# Patient Record
Sex: Female | Born: 1950 | Race: White | Hispanic: No | Marital: Married | Smoking: Never smoker
Health system: Southern US, Community
[De-identification: ages and names within clinical notes are randomized; demographics above are authoritative.]

## PROBLEM LIST (undated history)

## (undated) DIAGNOSIS — M62838 Other muscle spasm: Secondary | ICD-10-CM

## (undated) DIAGNOSIS — D649 Anemia, unspecified: Secondary | ICD-10-CM

## (undated) DIAGNOSIS — M4800 Spinal stenosis, site unspecified: Secondary | ICD-10-CM

## (undated) DIAGNOSIS — M1990 Unspecified osteoarthritis, unspecified site: Secondary | ICD-10-CM

## (undated) DIAGNOSIS — I6529 Occlusion and stenosis of unspecified carotid artery: Secondary | ICD-10-CM

## (undated) DIAGNOSIS — E079 Disorder of thyroid, unspecified: Secondary | ICD-10-CM

## (undated) HISTORY — PX: COLONOSCOPY: SHX174

## (undated) HISTORY — DX: Unspecified osteoarthritis, unspecified site: M19.90

## (undated) HISTORY — PX: OVARIAN CYST REMOVAL: SHX89

## (undated) HISTORY — PX: DILATION AND CURETTAGE OF UTERUS: SHX78

## (undated) HISTORY — DX: Occlusion and stenosis of unspecified carotid artery: I65.29

## (undated) HISTORY — PX: BACK SURGERY: SHX140

## (undated) HISTORY — PX: BREAST BIOPSY: SHX20

## (undated) HISTORY — DX: Anemia, unspecified: D64.9

## (undated) HISTORY — DX: Disorder of thyroid, unspecified: E07.9

---

## 1978-06-29 DIAGNOSIS — E079 Disorder of thyroid, unspecified: Secondary | ICD-10-CM

## 1978-06-29 HISTORY — DX: Disorder of thyroid, unspecified: E07.9

## 2004-02-20 ENCOUNTER — Encounter: Admission: RE | Admit: 2004-02-20 | Discharge: 2004-02-20 | Payer: Self-pay | Admitting: Endocrinology

## 2012-02-28 HISTORY — PX: X-STOP IMPLANTATION: SHX2677

## 2012-08-08 ENCOUNTER — Ambulatory Visit (INDEPENDENT_AMBULATORY_CARE_PROVIDER_SITE_OTHER): Payer: BC Managed Care – PPO | Admitting: General Surgery

## 2012-08-08 ENCOUNTER — Encounter (INDEPENDENT_AMBULATORY_CARE_PROVIDER_SITE_OTHER): Payer: Self-pay | Admitting: General Surgery

## 2012-08-08 VITALS — BP 132/76 | HR 64 | Temp 97.5°F | Resp 14 | Ht 68.0 in | Wt 156.8 lb

## 2012-08-08 DIAGNOSIS — N6320 Unspecified lump in the left breast, unspecified quadrant: Secondary | ICD-10-CM

## 2012-08-08 DIAGNOSIS — N63 Unspecified lump in unspecified breast: Secondary | ICD-10-CM

## 2012-08-08 NOTE — Progress Notes (Signed)
Patient ID: Beth Mcdowell, female   DOB: [birth year], 62 y.o.   MRN: [id number]  Chief Complaint  Patient presents with  . Breast Problem    new pt- eval breast sclerosing lesion    HPI Beth Mcdowell is a 62 y.o. female.  Referred by Dr Jeralyn Ruths HPI This is a 62 year old female who is otherwise healthy who presented for her routine screening mammogram. She underwent a mammogram that was noted to have in the left breast in the slightly outer upper aspect a region of architectural distortion. Additional views with a 3-D mammogram were obtained. They confirmed a 1.2 cm focus of architectural distortion seen more clearly on these mammograms. An ultrasound was also performed at that time which showed focal architectural distortion at 12:00 3 cm from the nipple. This area measured 1.2 cm in diameter parallel to the chest wall. This underwent ultrasound guided vacuum assisted core biopsy with tissue marker placement. 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 This is thought to be discordant and she has been sent over for evaluation for excision. She reports no complaints referable to either breast. Past Medical History  Diagnosis Date  . Anemia   . Allergy   . Thyroid disease 1980    nodule- treated w/ synthroid for a couple of years    Past Surgical History  Procedure Laterality Date  . X-stop implantation  02/2012  . Ovarian cyst removal  1977, 1980    Family History  Problem Relation Age of Onset  . Heart disease Mother   . Heart disease Father     Social History History  Substance Use Topics  . Smoking status: Never Smoker   . Smokeless tobacco: Not on file  . Alcohol Use: Yes     Comment: 3 glasses/week    Allergies  Allergen Reactions  . Erythromycin     GI upset... Pt stays away from all mycins    Current Outpatient Prescriptions  Medication Sig Dispense Refill  . Ascorbic Acid (VITAMIN C) 100 MG tablet Take 100 mg by mouth  daily.      Marland Kitchen ibuprofen (ADVIL,MOTRIN) 200 MG tablet Take 200 mg by mouth every 6 (six) hours as needed for pain.       No current facility-administered medications for this visit.    Review of Systems Review of Systems  Constitutional: Negative for fever, chills and unexpected weight change.  HENT: Negative for hearing loss, congestion, sore throat, trouble swallowing and voice change.   Eyes: Negative for visual disturbance.  Respiratory: Negative for cough and wheezing.   Cardiovascular: Negative for chest pain, palpitations and leg swelling.  Gastrointestinal: Negative for nausea, vomiting, abdominal pain, diarrhea, constipation, blood in stool, abdominal distention and anal bleeding.  Genitourinary: Negative for hematuria, vaginal bleeding and difficulty urinating.  Musculoskeletal: Positive for arthralgias.  Skin: Negative for rash and wound.  Neurological: Negative for seizures, syncope and headaches.  Hematological: Negative for adenopathy. Does not bruise/bleed easily.  Psychiatric/Behavioral: Negative for confusion.    Blood pressure 132/76, pulse 64, temperature 97.5 F (36.4 C), temperature source Temporal, resp. rate 14, height 5\' 8"  (1.727 m), weight 156 lb 12.8 oz (71.124 kg).  Physical Exam Physical Exam  Vitals reviewed. Constitutional: She appears well-developed and well-nourished.  Cardiovascular: Normal rate, regular rhythm and normal heart sounds.   Pulmonary/Chest: Effort normal and breath sounds normal. She has no wheezes. She has no rales. Right breast exhibits no inverted nipple, no mass, no nipple discharge, no skin  change and no tenderness. Left breast exhibits no inverted nipple, no mass, no nipple discharge, no skin change and no tenderness. Breasts are symmetrical.  Lymphadenopathy:    She has no cervical adenopathy.    Data Reviewed mmg [place] reports, mmg images, path  Assessment    Left breast mammographic abnormality     Plan    We discussed  the indication for an excisional biopsy of this area. We discussed this was to ensure that there is marked it is any breast cancer. We discussed possible six-month followup. I think he would be more prudent to excise this area though given the discordance. We discussed the risks of a wire localized excisional biopsy including bleeding, infection, further surgery if there is cancer that is identified. However I think this likelihood is very low given her biopsy in appearance. There about to have another grandchild in about 3 weeks and she would like to do this in about 6 weeks which I think would be reasonable.       Beth Mcdowell 08/08/2012, 10:46 AM

## 2012-08-09 ENCOUNTER — Encounter (INDEPENDENT_AMBULATORY_CARE_PROVIDER_SITE_OTHER): Payer: Self-pay

## 2012-09-28 ENCOUNTER — Encounter (HOSPITAL_BASED_OUTPATIENT_CLINIC_OR_DEPARTMENT_OTHER): Payer: Self-pay

## 2012-09-28 ENCOUNTER — Ambulatory Visit (HOSPITAL_BASED_OUTPATIENT_CLINIC_OR_DEPARTMENT_OTHER): Admit: 2012-09-28 | Payer: Self-pay | Admitting: General Surgery

## 2012-09-28 SURGERY — BREAST BIOPSY WITH NEEDLE LOCALIZATION
Anesthesia: General | Laterality: Left

## 2012-10-18 ENCOUNTER — Encounter (INDEPENDENT_AMBULATORY_CARE_PROVIDER_SITE_OTHER): Payer: BC Managed Care – PPO | Admitting: General Surgery

## 2013-02-13 ENCOUNTER — Ambulatory Visit (INDEPENDENT_AMBULATORY_CARE_PROVIDER_SITE_OTHER): Payer: BC Managed Care – PPO | Admitting: General Surgery

## 2013-02-13 ENCOUNTER — Encounter (INDEPENDENT_AMBULATORY_CARE_PROVIDER_SITE_OTHER): Payer: Self-pay | Admitting: General Surgery

## 2013-02-13 VITALS — BP 126/64 | HR 78 | Temp 97.1°F | Resp 14 | Ht 68.0 in | Wt 152.6 lb

## 2013-02-13 DIAGNOSIS — N63 Unspecified lump in unspecified breast: Secondary | ICD-10-CM

## 2013-02-13 DIAGNOSIS — N6320 Unspecified lump in the left breast, unspecified quadrant: Secondary | ICD-10-CM

## 2013-02-13 NOTE — Progress Notes (Signed)
Patient ID: Beth Mcdowell, female   DOB: 11/24/1950, 62 y.o.   MRN: [id number]  Chief Complaint  Patient presents with  . Breast Cancer Long Term Follow Up    HPI Beth Mcdowell is a 62 y.o. female.   HPI This is a 62 year old female who is otherwise healthy who presented for her routine screening mammogram. She underwent a mammogram that was noted to have in the left breast in the slightly outer upper aspect a region of architectural distortion. Additional views with a 3-D mammogram were obtained. They confirmed a 1.2 cm focus of architectural distortion seen more clearly on these mammograms. An ultrasound was also performed at that time which showed focal architectural distortion at 12:00 3 cm from the nipple. This area measured 1.2 cm in diameter parallel to the chest wall. This underwent ultrasound guided vacuum assisted core biopsy with tissue marker placement. This was discordant at the time. She ended up choosing to undergo short interval followup. She is aware complaints referable to her breast right now. She does a fair amount of hip pain for which she was asking [place] to she should see. I referred her to Dr. Clelia Croft as I think she will need to see an orthopedist. On reevaluation a focal area of architectural distortion left breast persists and is slightly more dense although the overall size has not changed significantly measured about 2 cm. There are no associated microcalcifications. On ultrasound this area measures about 2 cm and appears to be stable. She was recommended by Dr. Yolanda Bonine consideration for excision again. She comes in today to discuss this and she is ready to have this excised..  Past Medical History  Diagnosis Date  . Anemia   . Allergy   . Thyroid disease 1980    nodule- treated w/ synthroid for a couple of years  . Arthritis     hips    Past Surgical History  Procedure Laterality Date  . X-stop implantation  02/2012  . Ovarian cyst removal  1977, 1980    Family History    Problem Relation Age of Onset  . Heart disease Mother   . Heart disease Father     Social History History  Substance Use Topics  . Smoking status: Never Smoker   . Smokeless tobacco: Not on file  . Alcohol Use: Yes     Comment: 3 glasses/week    Allergies  Allergen Reactions  . Erythromycin     GI upset... Pt stays away from all mycins    Current Outpatient Prescriptions  Medication Sig Dispense Refill  . Ascorbic Acid (VITAMIN C) 100 MG tablet Take 100 mg by mouth daily.      Marland Kitchen gabapentin (NEURONTIN) 100 MG capsule Take 100 mg by mouth 3 (three) times daily.      Marland Kitchen ibuprofen (ADVIL,MOTRIN) 200 MG tablet Take 200 mg by mouth every 6 (six) hours as needed for pain.       No current facility-administered medications for this visit.    Review of Systems Review of Systems  Constitutional: Negative for fever, chills and unexpected weight change.  HENT: Negative for hearing loss, congestion, sore throat, trouble swallowing and voice change.   Eyes: Negative for visual disturbance.  Respiratory: Negative for cough and wheezing.   Cardiovascular: Negative for chest pain, palpitations and leg swelling.  Gastrointestinal: Negative for nausea, vomiting, abdominal pain, diarrhea, constipation, blood in stool, abdominal distention and anal bleeding.  Genitourinary: Negative for hematuria, vaginal bleeding and difficulty urinating.  Musculoskeletal: Positive  for arthralgias.  Skin: Negative for rash and wound.  Neurological: Negative for seizures, syncope and headaches.  Hematological: Negative for adenopathy. Does not bruise/bleed easily.  Psychiatric/Behavioral: Negative for confusion.    Blood pressure 126/64, pulse 78, temperature 97.1 F (36.2 C), temperature source Temporal, resp. rate 14, height 5\' 8"  (1.727 m), weight 152 lb 9.6 oz (69.219 kg).  Physical Exam Physical Exam  Vitals reviewed. Constitutional: She appears well-developed and well-nourished.  Neck: Neck  supple. No thyromegaly present.  Cardiovascular: Normal rate, regular rhythm and normal heart sounds.   Pulmonary/Chest: Effort normal and breath sounds normal. She has no wheezes. She has no rales. Right breast exhibits no inverted nipple, no mass, no nipple discharge, no skin change and no tenderness. Left breast exhibits mass. Left breast exhibits no inverted nipple, no nipple discharge, no skin change and no tenderness.    Lymphadenopathy:    She has no cervical adenopathy.    Data Reviewed Prior notes and recent mm from [facility]  Assessment    Left breast mass     Plan    Left breast wire guided excision  This year he has gotten a little bit bigger since her last exam. She would like to proceed now with consideration of excisional biopsy. Really nothing much has changed since her last visit. I discussed with him again excisional biopsy with wire guidance. Risks include but are not limited to bleeding, infection, further surgery. We will plan on doing this as soon as we can        [facility] 02/13/2013, 9:18 AM

## 2013-02-15 ENCOUNTER — Encounter (HOSPITAL_COMMUNITY): Payer: Self-pay | Admitting: Pharmacy Technician

## 2013-02-16 NOTE — Pre-Procedure Instructions (Signed)
Beth Mcdowell  02/16/2013   Your procedure is scheduled on:  Monday February 20, 2013.  Report to [facility] immediately after leaving [facility].  Call this number if you have problems the morning of surgery: [phone]   Remember:   Do not eat food or drink liquids after midnight.   Take these medicines the morning of surgery with A SIP OF WATER: Gabapentin (Neurontin) if needed for pain  Stop taking Aspirin and herbal medications. Do not take any NSAIDs ie: Ibuprofen, Advil, Naproxen or any medication  containing Aspirin (naproxen sodium (ANAPROX) 220 MG tablet and Ibuprofen-Diphenhydramine Cit (ADVIL PM PO   Do not wear jewelry, make-up or nail polish.  Do not wear lotions, powders, or perfumes. You may NOT wear deodorant.  Do not shave 48 hours prior to surgery. .  Do not bring valuables to the hospital.  [facility] is not responsible for any belongings or valuables.  Contacts, dentures or bridgework may not be worn into surgery.  Leave suitcase in the car. After surgery it may be brought to your room.  For patients admitted to the hospital, checkout time is 11:00 AM the day of discharge.   Patients discharged the day of surgery will not be allowed to drive home.  Name and phone number of your driver: Family/Friend  Special Instructions: Shower using CHG 2 nights before surgery and the night before surgery.  If you shower the day of surgery use CHG.  Use special wash - you have one bottle of CHG for all showers.  You should use approximately 1/3 of the bottle for each shower.   Please read over the following fact sheets that you were given: Pain Booklet, Coughing and Deep Breathing and Surgical Site Infection Prevention

## 2013-02-17 ENCOUNTER — Encounter (HOSPITAL_COMMUNITY)
Admission: RE | Admit: 2013-02-17 | Discharge: 2013-02-17 | Disposition: A | Discharge status: Home / Self Care | Payer: BC Managed Care – PPO | Source: Ambulatory Visit | Attending: General Surgery | Admitting: General Surgery

## 2013-02-17 ENCOUNTER — Encounter (HOSPITAL_COMMUNITY): Payer: Self-pay

## 2013-02-17 HISTORY — DX: Spinal stenosis, site unspecified: M48.00

## 2013-02-17 LAB — BASIC METABOLIC PANEL
BUN: 19 mg/dL (ref 6–23)
CO2: 18 mEq/L — ABNORMAL LOW (ref 19–32)
Calcium: 9.9 mg/dL (ref 8.4–10.5)
Chloride: 105 mEq/L (ref 96–112)
Creatinine, Ser: 0.91 mg/dL (ref 0.50–1.10)
GFR calc Af Amer: 77 mL/min — ABNORMAL LOW (ref >90)

## 2013-02-17 LAB — CBC
HCT: 37.2 % (ref 36.0–46.0)
MCHC: 33.6 g/dL (ref 30.0–36.0)
MCV: 87.5 fL (ref 78.0–100.0)
Platelets: 224 10*3/uL (ref 150–400)
RDW: 12.6 % (ref 11.5–15.5)

## 2013-02-17 NOTE — Progress Notes (Signed)
Pt denies SOB, chest pain, and being under the care of a cardiologist. Pt denies having a stress test, echo, and cardiac catheterization. A  Message (voicemail) was left on Dr. Doreen Salvage surgical scheduler's telephone to verify where pt surgery is to be performed ( per pt).

## 2013-02-17 NOTE — Progress Notes (Signed)
Spoke with Katie from Dr. Kerry Dory office, she confirmed that pt is having procedure here at the [facility].

## 2013-02-19 MED ORDER — CEFAZOLIN SODIUM-DEXTROSE 2-3 GM-% IV SOLR
2.0000 g | INTRAVENOUS | Status: AC
  Administered 2013-02-20: 2 g via INTRAVENOUS
  Filled 2013-02-19: qty 50

## 2013-02-20 ENCOUNTER — Encounter (HOSPITAL_COMMUNITY)
Admission: RE | Disposition: A | Discharge status: Home / Self Care | Payer: Self-pay | Source: Ambulatory Visit | Attending: General Surgery

## 2013-02-20 ENCOUNTER — Ambulatory Visit (HOSPITAL_COMMUNITY)
Admission: RE | Admit: 2013-02-20 | Discharge: 2013-02-20 | Disposition: A | Discharge status: Home / Self Care | Payer: BC Managed Care – PPO | Source: Ambulatory Visit | Attending: General Surgery | Admitting: General Surgery

## 2013-02-20 ENCOUNTER — Ambulatory Visit (HOSPITAL_COMMUNITY): Payer: BC Managed Care – PPO | Admitting: Anesthesiology

## 2013-02-20 ENCOUNTER — Encounter (HOSPITAL_COMMUNITY): Payer: Self-pay | Admitting: Anesthesiology

## 2013-02-20 ENCOUNTER — Encounter (HOSPITAL_COMMUNITY): Payer: Self-pay | Admitting: *Deleted

## 2013-02-20 DIAGNOSIS — N6089 Other benign mammary dysplasias of unspecified breast: Secondary | ICD-10-CM

## 2013-02-20 HISTORY — PX: BREAST BIOPSY: SHX20

## 2013-02-20 LAB — HM MAMMOGRAPHY

## 2013-02-20 SURGERY — BREAST BIOPSY WITH NEEDLE LOCALIZATION
Anesthesia: General | Site: Breast | Laterality: Left | Wound class: Clean

## 2013-02-20 MED ORDER — PHENYLEPHRINE HCL 10 MG/ML IJ SOLN
INTRAMUSCULAR | Status: DC | PRN
  Administered 2013-02-20 (×2): 40 ug via INTRAVENOUS

## 2013-02-20 MED ORDER — OXYCODONE HCL 5 MG PO TABS: 5.0000 mg | ORAL_TABLET | Freq: Once | ORAL | Status: DC | PRN

## 2013-02-20 MED ORDER — METOCLOPRAMIDE HCL 5 MG/ML IJ SOLN
INTRAMUSCULAR | Status: DC | PRN
  Administered 2013-02-20: 5 mg via INTRAVENOUS

## 2013-02-20 MED ORDER — BUPIVACAINE HCL (PF) 0.25 % IJ SOLN
INTRAMUSCULAR | Status: AC
  Filled 2013-02-20: qty 30

## 2013-02-20 MED ORDER — SODIUM CHLORIDE 0.9 % IV SOLN: INTRAVENOUS | Status: DC

## 2013-02-20 MED ORDER — 0.9 % SODIUM CHLORIDE (POUR BTL) OPTIME
TOPICAL | Status: DC | PRN
  Administered 2013-02-20: 1000 mL

## 2013-02-20 MED ORDER — ONDANSETRON HCL 4 MG/2ML IJ SOLN
INTRAMUSCULAR | Status: DC | PRN
  Administered 2013-02-20: 4 mg via INTRAVENOUS

## 2013-02-20 MED ORDER — ONDANSETRON HCL 4 MG/2ML IJ SOLN: 4.0000 mg | Freq: Four times a day (QID) | INTRAMUSCULAR | Status: DC | PRN

## 2013-02-20 MED ORDER — DEXAMETHASONE SODIUM PHOSPHATE 4 MG/ML IJ SOLN
INTRAMUSCULAR | Status: DC | PRN
  Administered 2013-02-20: 4 mg via INTRAVENOUS

## 2013-02-20 MED ORDER — LACTATED RINGERS IV SOLN
INTRAVENOUS | Status: DC | PRN
  Administered 2013-02-20: 11:00:00 via INTRAVENOUS

## 2013-02-20 MED ORDER — BUPIVACAINE-EPINEPHRINE 0.25% -1:200000 IJ SOLN
INTRAMUSCULAR | Status: DC | PRN
  Administered 2013-02-20: 20 mL

## 2013-02-20 MED ORDER — ACETAMINOPHEN 650 MG RE SUPP: 650.0000 mg | RECTAL | Status: DC | PRN

## 2013-02-20 MED ORDER — SODIUM CHLORIDE 0.9 % IJ SOLN: 3.0000 mL | INTRAMUSCULAR | Status: DC | PRN

## 2013-02-20 MED ORDER — FENTANYL CITRATE 0.05 MG/ML IJ SOLN
INTRAMUSCULAR | Status: DC | PRN
  Administered 2013-02-20: 50 ug via INTRAVENOUS

## 2013-02-20 MED ORDER — ACETAMINOPHEN 325 MG PO TABS: 650.0000 mg | ORAL_TABLET | ORAL | Status: DC | PRN

## 2013-02-20 MED ORDER — EPHEDRINE SULFATE 50 MG/ML IJ SOLN
INTRAMUSCULAR | Status: DC | PRN
  Administered 2013-02-20: 10 mg via INTRAVENOUS
  Administered 2013-02-20: 5 mg via INTRAVENOUS
  Administered 2013-02-20: 10 mg via INTRAVENOUS

## 2013-02-20 MED ORDER — MORPHINE SULFATE 2 MG/ML IJ SOLN: 2.0000 mg | INTRAMUSCULAR | Status: DC | PRN

## 2013-02-20 MED ORDER — SODIUM CHLORIDE 0.9 % IV SOLN: 250.0000 mL | INTRAVENOUS | Status: DC | PRN

## 2013-02-20 MED ORDER — LIDOCAINE HCL (CARDIAC) 20 MG/ML IV SOLN
INTRAVENOUS | Status: DC | PRN
  Administered 2013-02-20: 50 mg via INTRAVENOUS

## 2013-02-20 MED ORDER — MIDAZOLAM HCL 5 MG/5ML IJ SOLN
INTRAMUSCULAR | Status: DC | PRN
  Administered 2013-02-20: 2 mg via INTRAVENOUS

## 2013-02-20 MED ORDER — HYDROMORPHONE HCL PF 1 MG/ML IJ SOLN: 0.2500 mg | INTRAMUSCULAR | Status: DC | PRN

## 2013-02-20 MED ORDER — OXYCODONE HCL 5 MG PO TABS: 5.0000 mg | ORAL_TABLET | ORAL | Status: DC | PRN

## 2013-02-20 MED ORDER — HYDROCODONE-ACETAMINOPHEN 10-325 MG PO TABS: 1.0000 | ORAL_TABLET | Freq: Four times a day (QID) | ORAL | Status: DC | PRN

## 2013-02-20 MED ORDER — SODIUM CHLORIDE 0.9 % IJ SOLN: 3.0000 mL | Freq: Two times a day (BID) | INTRAMUSCULAR | Status: DC

## 2013-02-20 MED ORDER — PROPOFOL 10 MG/ML IV BOLUS
INTRAVENOUS | Status: DC | PRN
  Administered 2013-02-20: 200 mg via INTRAVENOUS

## 2013-02-20 MED ORDER — LACTATED RINGERS IV SOLN
INTRAVENOUS | Status: DC
  Administered 2013-02-20: 10:00:00 via INTRAVENOUS

## 2013-02-20 MED ORDER — OXYCODONE HCL 5 MG/5ML PO SOLN: 5.0000 mg | Freq: Once | ORAL | Status: DC | PRN

## 2013-02-20 SURGICAL SUPPLY — 44 items
ADH SKN CLS APL DERMABOND .7 (GAUZE/BANDAGES/DRESSINGS) ×1
APPLIER CLIP 9.375 MED OPEN (MISCELLANEOUS)
APR CLP MED 9.3 20 MLT OPN (MISCELLANEOUS)
BINDER BREAST LRG (GAUZE/BANDAGES/DRESSINGS) IMPLANT
BINDER BREAST XLRG (GAUZE/BANDAGES/DRESSINGS) IMPLANT
CANISTER SUCTION 2500CC (MISCELLANEOUS) IMPLANT
CHLORAPREP W/TINT 26ML (MISCELLANEOUS) ×3 IMPLANT
CLIP APPLIE 9.375 MED OPEN (MISCELLANEOUS) IMPLANT
CLOSURE WOUND 1/2 X4 (GAUZE/BANDAGES/DRESSINGS) ×1
CLOTH BEACON ORANGE TIMEOUT ST (SAFETY) ×3 IMPLANT
COVER SURGICAL LIGHT HANDLE (MISCELLANEOUS) ×3 IMPLANT
DECANTER SPIKE VIAL GLASS SM (MISCELLANEOUS) ×3 IMPLANT
DERMABOND ADVANCED (GAUZE/BANDAGES/DRESSINGS) ×2
DERMABOND ADVANCED .7 DNX12 (GAUZE/BANDAGES/DRESSINGS) IMPLANT
DEVICE DUBIN SPECIMEN MAMMOGRA (MISCELLANEOUS) ×3 IMPLANT
DRAPE CHEST BREAST 15X10 FENES (DRAPES) ×3 IMPLANT
ELECT CAUTERY BLADE 6.4 (BLADE) ×3 IMPLANT
ELECT REM PT RETURN 9FT ADLT (ELECTROSURGICAL) ×3
ELECTRODE REM PT RTRN 9FT ADLT (ELECTROSURGICAL) ×1 IMPLANT
GLOVE BIO SURGEON STRL SZ7 (GLOVE) ×3 IMPLANT
GLOVE BIOGEL PI IND STRL 6.5 (GLOVE) IMPLANT
GLOVE BIOGEL PI IND STRL 7.5 (GLOVE) ×1 IMPLANT
GLOVE BIOGEL PI INDICATOR 6.5 (GLOVE) ×4
GLOVE BIOGEL PI INDICATOR 7.5 (GLOVE) ×4
GLOVE SURG SS PI 6.5 STRL IVOR (GLOVE) ×2 IMPLANT
GLOVE SURG SS PI 7.5 STRL IVOR (GLOVE) ×2 IMPLANT
GOWN STRL NON-REIN LRG LVL3 (GOWN DISPOSABLE) ×6 IMPLANT
KIT BASIN OR (CUSTOM PROCEDURE TRAY) ×3 IMPLANT
KIT MARKER MARGIN INK (KITS) IMPLANT
KIT ROOM TURNOVER OR (KITS) ×3 IMPLANT
NEEDLE HYPO 25GX1X1/2 BEV (NEEDLE) ×3 IMPLANT
NS IRRIG 1000ML POUR BTL (IV SOLUTION) ×3 IMPLANT
PACK GENERAL/GYN (CUSTOM PROCEDURE TRAY) ×3 IMPLANT
PAD ARMBOARD 7.5X6 YLW CONV (MISCELLANEOUS) ×3 IMPLANT
STAPLER VISISTAT 35W (STAPLE) ×3 IMPLANT
STRIP CLOSURE SKIN 1/2X4 (GAUZE/BANDAGES/DRESSINGS) ×1 IMPLANT
SUT MNCRL AB 4-0 PS2 18 (SUTURE) ×3 IMPLANT
SUT SILK 2 0 SH (SUTURE) IMPLANT
SUT VIC AB 2-0 SH 27 (SUTURE) ×3
SUT VIC AB 2-0 SH 27XBRD (SUTURE) IMPLANT
SUT VIC AB 3-0 SH 18 (SUTURE) ×3 IMPLANT
SYR CONTROL 10ML LL (SYRINGE) ×3 IMPLANT
TOWEL OR 17X24 6PK STRL BLUE (TOWEL DISPOSABLE) ×3 IMPLANT
TOWEL OR 17X26 10 PK STRL BLUE (TOWEL DISPOSABLE) ×3 IMPLANT

## 2013-02-20 NOTE — Anesthesia Postprocedure Evaluation (Signed)
Anesthesia Post Note  Patient: Beth Mcdowell  Procedure(s) Performed: Procedure(s) (LRB): Left breast wire guided excision (Left)  Anesthesia type: General  Patient location: PACU  Post pain: Pain level controlled and Adequate analgesia  Post assessment: Post-op Vital signs reviewed, Patient's Cardiovascular Status Stable, Respiratory Function Stable, Patent Airway and Pain level controlled  Last Vitals:  Filed Vitals:   02/20/13 1236  BP: 121/61  Pulse: 102  Temp: 36.3 C  Resp:     Post vital signs: Reviewed and stable  Level of consciousness: awake, alert  and oriented  Complications: No apparent anesthesia complications

## 2013-02-20 NOTE — Transfer of Care (Signed)
Immediate Anesthesia Transfer of Care Note  Patient: Beth Mcdowell  Procedure(s) Performed: Procedure(s): Left breast wire guided excision (Left)  Patient Location: PACU  Anesthesia Type:General  Level of Consciousness: awake, alert  and oriented  Airway & Oxygen Therapy: Patient Spontanous Breathing and Patient connected to nasal cannula oxygen  Post-op Assessment: Report given to PACU RN and Post -op Vital signs reviewed and stable  Post vital signs: Reviewed and stable  Complications: No apparent anesthesia complications

## 2013-02-20 NOTE — H&P (View-Only) (Signed)
Patient ID: Beth Mcdowell, female   DOB: 08/04/1950, 61 y.o.   MRN: [id number]  Chief Complaint  Patient presents with  . Breast Cancer Long Term Follow Up    HPI Beth Mcdowell is a 61 y.o. female.   HPI This is a 61-year-old female who is otherwise healthy who presented for her routine screening mammogram. She underwent a mammogram that was noted to have in the left breast in the slightly outer upper aspect a region of architectural distortion. Additional views with a 3-D mammogram were obtained. They confirmed a 1.2 cm focus of architectural distortion seen more clearly on these mammograms. An ultrasound was also performed at that time which showed focal architectural distortion at 12:00 3 cm from the nipple. This area measured 1.2 cm in diameter parallel to the chest wall. This underwent ultrasound guided vacuum assisted core biopsy with tissue marker placement. This was discordant at the time. She ended up choosing to undergo short interval followup. She is aware complaints referable to her breast right now. She does a fair amount of hip pain for which she was asking us to she should see. I referred her to Dr. Shaw as I think she will need to see an orthopedist. On reevaluation a focal area of architectural distortion left breast persists and is slightly more dense although the overall size has not changed significantly measured about 2 cm. There are no associated microcalcifications. On ultrasound this area measures about 2 cm and appears to be stable. She was recommended by Dr. Bertrand consideration for excision again. She comes in today to discuss this and she is ready to have this excised..  Past Medical History  Diagnosis Date  . Anemia   . Allergy   . Thyroid disease 1980    nodule- treated w/ synthroid for a couple of years  . Arthritis     hips    Past Surgical History  Procedure Laterality Date  . X-stop implantation  02/2012  . Ovarian cyst removal  1977, 1980    Family History    Problem Relation Age of Onset  . Heart disease Mother   . Heart disease Father     Social History History  Substance Use Topics  . Smoking status: Never Smoker   . Smokeless tobacco: Not on file  . Alcohol Use: Yes     Comment: 3 glasses/week    Allergies  Allergen Reactions  . Erythromycin     GI upset... Pt stays away from all mycins    Current Outpatient Prescriptions  Medication Sig Dispense Refill  . Ascorbic Acid (VITAMIN C) 100 MG tablet Take 100 mg by mouth daily.      . gabapentin (NEURONTIN) 100 MG capsule Take 100 mg by mouth 3 (three) times daily.      . ibuprofen (ADVIL,MOTRIN) 200 MG tablet Take 200 mg by mouth every 6 (six) hours as needed for pain.       No current facility-administered medications for this visit.    Review of Systems Review of Systems  Constitutional: Negative for fever, chills and unexpected weight change.  HENT: Negative for hearing loss, congestion, sore throat, trouble swallowing and voice change.   Eyes: Negative for visual disturbance.  Respiratory: Negative for cough and wheezing.   Cardiovascular: Negative for chest pain, palpitations and leg swelling.  Gastrointestinal: Negative for nausea, vomiting, abdominal pain, diarrhea, constipation, blood in stool, abdominal distention and anal bleeding.  Genitourinary: Negative for hematuria, vaginal bleeding and difficulty urinating.  Musculoskeletal: Positive   for arthralgias.  Skin: Negative for rash and wound.  Neurological: Negative for seizures, syncope and headaches.  Hematological: Negative for adenopathy. Does not bruise/bleed easily.  Psychiatric/Behavioral: Negative for confusion.    Blood pressure 126/64, pulse 78, temperature 97.1 F (36.2 C), temperature source Temporal, resp. rate 14, height 5' 8" (1.727 m), weight 152 lb 9.6 oz (69.219 kg).  Physical Exam Physical Exam  Vitals reviewed. Constitutional: She appears well-developed and well-nourished.  Neck: Neck  supple. No thyromegaly present.  Cardiovascular: Normal rate, regular rhythm and normal heart sounds.   Pulmonary/Chest: Effort normal and breath sounds normal. She has no wheezes. She has no rales. Right breast exhibits no inverted nipple, no mass, no nipple discharge, no skin change and no tenderness. Left breast exhibits mass. Left breast exhibits no inverted nipple, no nipple discharge, no skin change and no tenderness.    Lymphadenopathy:    She has no cervical adenopathy.    Data Reviewed Prior notes and recent mm from [facility]  Assessment    Left breast mass     Plan    Left breast wire guided excision  This year he has gotten a little bit bigger since her last exam. She would like to proceed now with consideration of excisional biopsy. Really nothing much has changed since her last visit. I discussed with him again excisional biopsy with wire guidance. Risks include but are not limited to bleeding, infection, further surgery. We will plan on doing this as soon as we can        Beth Mcdowell 02/13/2013, 9:18 AM    

## 2013-02-20 NOTE — Anesthesia Procedure Notes (Signed)
Procedure Name: LMA Insertion Date/Time: 02/20/2013 11:32 AM Performed by: Carmela Rima Pre-anesthesia Checklist: Patient identified, Timeout performed, Emergency Drugs available, Patient being monitored and Suction available Patient Re-evaluated:Patient Re-evaluated prior to inductionOxygen Delivery Method: Circle system utilized Preoxygenation: Pre-oxygenation with 100% oxygen Intubation Type: IV induction Ventilation: Mask ventilation without difficulty LMA: LMA inserted LMA Size: 4.0 Number of attempts: 1 Placement Confirmation: positive ETCO2 and breath sounds checked- equal and bilateral Tube secured with: Tape Dental Injury: Teeth and Oropharynx as per pre-operative assessment

## 2013-02-20 NOTE — Anesthesia Preprocedure Evaluation (Addendum)
Anesthesia Evaluation  Patient identified by MRN, date of birth, ID band Patient awake    Reviewed: Allergy & Precautions, H&P , NPO status , Patient's Chart, lab work & pertinent test results  Airway Mallampati: II TM Distance: >3 FB Neck ROM: full    Dental  (+) Teeth Intact and Dental Advidsory Given   Pulmonary          Cardiovascular     Neuro/Psych    GI/Hepatic   Endo/Other    Renal/GU      Musculoskeletal  (+) Arthritis -,   Abdominal   Peds  Hematology   Anesthesia Other Findings   Reproductive/Obstetrics                          Anesthesia Physical Anesthesia Plan  ASA: II  Anesthesia Plan: General   Post-op Pain Management:    Induction: Intravenous  Airway Management Planned: LMA  Additional Equipment:   Intra-op Plan:   Post-operative Plan:   Informed Consent: I have reviewed the patients History and Physical, chart, labs and discussed the procedure including the risks, benefits and alternatives for the proposed anesthesia with the patient or authorized representative who has indicated his/her understanding and acceptance.   Dental Advisory Given  Plan Discussed with: CRNA, Anesthesiologist and Surgeon  Anesthesia Plan Comments:        Anesthesia Quick Evaluation

## 2013-02-20 NOTE — Preoperative (Signed)
Beta Blockers   Reason not to administer Beta Blockers:Not Applicable 

## 2013-02-20 NOTE — Interval H&P Note (Signed)
History and Physical Interval Note:  02/20/2013 11:13 AM  Beth Mcdowell  has presented today for surgery, with the diagnosis of left breast mass  The various methods of treatment have been discussed with the patient and family. After consideration of risks, benefits and other options for treatment, the patient has consented to  Procedure(s): LEft breast wire guided excision (Left) as a surgical intervention .  The patient's history has been reviewed, patient examined, no change in status, stable for surgery.  I have reviewed the patient's chart and labs.  Questions were answered to the patient's satisfaction.     Zelphia Glover

## 2013-02-20 NOTE — Op Note (Signed)
Preoperative diagnosis: left breast mammographic abnormality Postoperative diagnosis: same as above Procedure: left breast wire guided excision Surgeon: Dr Harden Mo Anes: general EBL: minimal Specimen: left breast marked with short stitch superior, long stitch lateral, double deep Drains: none Complications: none Sponge and needle count correct at completion Disposition to recovery in stable condition  Indications: This is a 62 year old female with a vaguely palpable left breast mass that underwent recent evaluation with a biopsy that was discordant. She decided undergo short-term followup and there has been some change in this area. She was encouraged for excision by the radiologist and I agree. We discussed a left breast excision with wire guidance.  Procedure: After informed consent was obtained the patient first had a wire placed. I had the mammograms in the operating room. She was given cefazolin. Sequential compression devices were placed on her legs. She was then placed under general anesthesia without complication. Her left breast was prepped and draped in the standard sterile surgical fashion. A surgical timeout was then performed.  I infiltrated Marcaine throughout the region of the abnormality. I then made a curvilinear incision overlying the abnormality. I brought the wire in from remotely. I then used cautery to excise the wire and all of the surrounding tissue. There was a vaguely palpable mass in this tissue. Upon completion the mammogram confirmed removal of the mass as well as the clip and wire. I then obtained hemostasis. I closed the breast tissue with 2-0 Vicryl. The dermis was closed with 3-0 Vicryl. The skin was closed with 4-0 Monocryl, and Dermabond, and Steri-Strips. She tolerated this well was extubated and transferred to recovery stable.

## 2013-02-21 ENCOUNTER — Encounter (HOSPITAL_COMMUNITY): Payer: Self-pay | Admitting: General Surgery

## 2013-03-07 ENCOUNTER — Encounter (INDEPENDENT_AMBULATORY_CARE_PROVIDER_SITE_OTHER): Payer: Self-pay | Admitting: General Surgery

## 2013-03-07 ENCOUNTER — Encounter (INDEPENDENT_AMBULATORY_CARE_PROVIDER_SITE_OTHER): Payer: Self-pay

## 2013-03-07 ENCOUNTER — Ambulatory Visit (INDEPENDENT_AMBULATORY_CARE_PROVIDER_SITE_OTHER): Payer: BC Managed Care – PPO | Admitting: General Surgery

## 2013-03-07 VITALS — BP 96/58 | HR 64 | Resp 16 | Ht 68.0 in | Wt 151.8 lb

## 2013-03-07 DIAGNOSIS — Z09 Encounter for follow-up examination after completed treatment for conditions other than malignant neoplasm: Secondary | ICD-10-CM

## 2013-03-07 NOTE — Progress Notes (Signed)
Subjective:     Patient ID: Beth Mcdowell, female   DOB: 13-Oct-1950, 62 y.o.   MRN: [id number]  HPI This is a 62 year old female who underwent a left breast wire localized biopsy for a mammographic abnormality. She has done well and returns today with no complaints. Her pathology showed a 7 mm radial scar without any evidence of malignancy.  Review of Systems     Objective:   Physical Exam Well-healing left breast incision with Steri-Strips in place, without infection    Assessment:     Status post left breast wire guided excision of radial scar     Plan:     She can return to full activity. We discussed her pathology today. We discussed continuing her annual screening mammography, screening clinical exams, and self breast exams. She will come back and see me as needed.

## 2013-03-16 ENCOUNTER — Encounter (INDEPENDENT_AMBULATORY_CARE_PROVIDER_SITE_OTHER): Payer: Self-pay

## 2013-03-28 ENCOUNTER — Encounter (INDEPENDENT_AMBULATORY_CARE_PROVIDER_SITE_OTHER): Payer: Self-pay

## 2013-05-18 ENCOUNTER — Encounter (HOSPITAL_COMMUNITY): Payer: Self-pay | Admitting: Pharmacy Technician

## 2013-05-23 ENCOUNTER — Encounter (HOSPITAL_COMMUNITY): Payer: Self-pay

## 2013-05-23 ENCOUNTER — Encounter (HOSPITAL_COMMUNITY)
Admission: RE | Admit: 2013-05-23 | Discharge: 2013-05-23 | Disposition: A | Discharge status: Home / Self Care | Payer: BC Managed Care – PPO | Source: Ambulatory Visit | Attending: Orthopaedic Surgery | Admitting: Orthopaedic Surgery

## 2013-05-23 ENCOUNTER — Other Ambulatory Visit (HOSPITAL_COMMUNITY): Payer: Self-pay | Admitting: Orthopaedic Surgery

## 2013-05-23 DIAGNOSIS — Z01818 Encounter for other preprocedural examination: Secondary | ICD-10-CM | POA: Insufficient documentation

## 2013-05-23 DIAGNOSIS — Z01812 Encounter for preprocedural laboratory examination: Secondary | ICD-10-CM | POA: Insufficient documentation

## 2013-05-23 HISTORY — DX: Other muscle spasm: M62.838

## 2013-05-23 LAB — SURGICAL PCR SCREEN
MRSA, PCR: NEGATIVE
Staphylococcus aureus: NEGATIVE

## 2013-05-23 LAB — ABO/RH: ABO/RH(D): A POS

## 2013-05-23 NOTE — Pre-Procedure Instructions (Signed)
PT BROUGHT LAB REPORTS- THAT WERE DONE BY DR. Lacretia Nicks. [facility] ON 05/16/13 - CBC, CMET, URINALYSIS.--REPORTS ARE ON PT'S CHART - WITH IN NORMAL LIMITS AND NOT REPEATED TODAY.  PT, PTT AND T&S WERE DRAWN TODAY PREOP AT [facility]. CXR AND EKG NOT NEEDED PER ANESTHESIOLOGIST'S GUIDELINES.  PT DID BRING HER LAST EKG REPORT - FROM 05/16/12 - NORMAL EKG.

## 2013-05-23 NOTE — Patient Instructions (Addendum)
YOUR SURGERY IS SCHEDULED AT [facility]  ON:  Friday  12/5  REPORT TO  SHORT STAY CENTER AT:  7:30 AM      PHONE # FOR SHORT STAY IS [phone]  DO NOT EAT OR DRINK ANYTHING AFTER MIDNIGHT THE NIGHT BEFORE YOUR SURGERY.  YOU MAY BRUSH YOUR TEETH, RINSE OUT YOUR MOUTH--BUT NO WATER, NO FOOD, NO CHEWING GUM, NO MINTS, NO CANDIES, NO CHEWING TOBACCO.  PLEASE TAKE THE FOLLOWING MEDICATIONS THE AM OF YOUR SURGERY WITH A FEW SIPS OF WATER:  HYDROCODONE / ACETAMINOPHEN FOR PAIN IF NEEDED.    DO NOT BRING VALUABLES, MONEY, CREDIT CARDS.  DO NOT WEAR JEWELRY, MAKE-UP, NAIL POLISH AND NO METAL PINS OR CLIPS IN YOUR HAIR. CONTACT LENS, DENTURES / PARTIALS, GLASSES SHOULD NOT BE WORN TO SURGERY AND IN MOST CASES-HEARING AIDS WILL NEED TO BE REMOVED.  BRING YOUR GLASSES CASE, ANY EQUIPMENT NEEDED FOR YOUR CONTACT LENS. FOR PATIENTS ADMITTED TO THE HOSPITAL--CHECK OUT TIME THE DAY OF DISCHARGE IS 11:00 AM.  ALL INPATIENT ROOMS ARE PRIVATE - WITH BATHROOM, TELEPHONE, TELEVISION AND WIFI INTERNET.                                                     PLEASE READ OVER ANY  FACT SHEETS THAT YOU WERE GIVEN: MRSA INFORMATION, BLOOD TRANSFUSION INFORMATION, INCENTIVE SPIROMETER INFORMATION.  FAILURE TO FOLLOW THESE INSTRUCTIONS MAY RESULT IN THE CANCELLATION OF YOUR SURGERY. PLEASE BE AWARE THAT YOU MAY NEED ADDITIONAL BLOOD DRAWN DAY OF YOUR SURGERY  PATIENT SIGNATURE_________________________________  PT'S HUSBAND NOTIFIED OF SURGERY TIME CHANGE TO 7:30 AM - AND SHE NEEDS TO ARRIVE BY 5:30 AM.   ALL OTHER INSTRUCTIONS ARE THE SAME.  PT TO CALL ME BACK IF ANY QUESTIONS.

## 2013-06-01 NOTE — Anesthesia Preprocedure Evaluation (Addendum)
Anesthesia Evaluation  Patient identified by MRN, date of birth, ID band Patient awake    Reviewed: Allergy & Precautions, H&P , NPO status , Patient's Chart, lab work & pertinent test results  Airway Mallampati: II TM Distance: >3 FB Neck ROM: full    Dental  (+) Caps and Dental Advisory Given All upper front are capped:   Pulmonary neg pulmonary ROS,  breath sounds clear to auscultation  Pulmonary exam normal       Cardiovascular Exercise Tolerance: Good negative cardio ROS  Rhythm:regular Rate:Normal     Neuro/Psych Spinal stenosis with spasms both legs and feet negative neurological ROS  negative psych ROS   GI/Hepatic negative GI ROS, Neg liver ROS,   Endo/Other  negative endocrine ROS  Renal/GU negative Renal ROS  negative genitourinary   Musculoskeletal   Abdominal   Peds  Hematology negative hematology ROS (+)   Anesthesia Other Findings   Reproductive/Obstetrics negative OB ROS                          Anesthesia Physical Anesthesia Plan  ASA: II  Anesthesia Plan: General   Post-op Pain Management:    Induction: Intravenous  Airway Management Planned: Oral ETT  Additional Equipment:   Intra-op Plan:   Post-operative Plan: Extubation in OR  Informed Consent: I have reviewed the patients History and Physical, chart, labs and discussed the procedure including the risks, benefits and alternatives for the proposed anesthesia with the patient or authorized representative who has indicated his/her understanding and acceptance.   Dental Advisory Given  Plan Discussed with: CRNA and Surgeon  Anesthesia Plan Comments:         Anesthesia Quick Evaluation

## 2013-06-02 ENCOUNTER — Encounter (HOSPITAL_COMMUNITY)
Admission: RE | Disposition: A | Discharge status: Home Health | Payer: Self-pay | Source: Ambulatory Visit | Attending: Orthopaedic Surgery

## 2013-06-02 ENCOUNTER — Encounter (HOSPITAL_COMMUNITY): Payer: BC Managed Care – PPO | Admitting: Anesthesiology

## 2013-06-02 ENCOUNTER — Inpatient Hospital Stay (HOSPITAL_COMMUNITY): Payer: BC Managed Care – PPO | Admitting: Anesthesiology

## 2013-06-02 ENCOUNTER — Inpatient Hospital Stay (HOSPITAL_COMMUNITY)
Admission: RE | Admit: 2013-06-02 | Discharge: 2013-06-04 | DRG: 470 | Disposition: A | Discharge status: Home Health | Payer: BC Managed Care – PPO | Source: Ambulatory Visit | Attending: Orthopaedic Surgery | Admitting: Orthopaedic Surgery

## 2013-06-02 ENCOUNTER — Inpatient Hospital Stay (HOSPITAL_COMMUNITY): Payer: BC Managed Care – PPO

## 2013-06-02 ENCOUNTER — Encounter (HOSPITAL_COMMUNITY): Payer: Self-pay | Admitting: *Deleted

## 2013-06-02 DIAGNOSIS — Z8249 Family history of ischemic heart disease and other diseases of the circulatory system: Secondary | ICD-10-CM

## 2013-06-02 DIAGNOSIS — M4800 Spinal stenosis, site unspecified: Secondary | ICD-10-CM | POA: Diagnosis present

## 2013-06-02 DIAGNOSIS — D62 Acute posthemorrhagic anemia: Secondary | ICD-10-CM | POA: Diagnosis present

## 2013-06-02 DIAGNOSIS — M1610 Unilateral primary osteoarthritis, unspecified hip: Principal | ICD-10-CM | POA: Diagnosis present

## 2013-06-02 DIAGNOSIS — Z79899 Other long term (current) drug therapy: Secondary | ICD-10-CM

## 2013-06-02 DIAGNOSIS — Z96649 Presence of unspecified artificial hip joint: Secondary | ICD-10-CM

## 2013-06-02 DIAGNOSIS — M169 Osteoarthritis of hip, unspecified: Secondary | ICD-10-CM

## 2013-06-02 HISTORY — PX: TOTAL HIP ARTHROPLASTY: SHX124

## 2013-06-02 LAB — TYPE AND SCREEN: Antibody Screen: NEGATIVE

## 2013-06-02 SURGERY — ARTHROPLASTY, HIP, TOTAL, ANTERIOR APPROACH
Anesthesia: General | Site: Hip | Laterality: Right

## 2013-06-02 MED ORDER — ONDANSETRON HCL 4 MG/2ML IJ SOLN
INTRAMUSCULAR | Status: DC | PRN
  Administered 2013-06-02: 4 mg via INTRAVENOUS

## 2013-06-02 MED ORDER — LACTATED RINGERS IV SOLN: INTRAVENOUS | Status: DC

## 2013-06-02 MED ORDER — TRANEXAMIC ACID 100 MG/ML IV SOLN
1000.0000 mg | INTRAVENOUS | Status: AC
  Administered 2013-06-02: 1000 mg via INTRAVENOUS
  Filled 2013-06-02: qty 10

## 2013-06-02 MED ORDER — BISACODYL 10 MG RE SUPP: 10.0000 mg | Freq: Every day | RECTAL | Status: DC | PRN

## 2013-06-02 MED ORDER — ASPIRIN EC 325 MG PO TBEC
325.0000 mg | DELAYED_RELEASE_TABLET | Freq: Two times a day (BID) | ORAL | Status: DC
  Administered 2013-06-02 – 2013-06-04 (×5): 325 mg via ORAL
  Filled 2013-06-02 (×6): qty 1

## 2013-06-02 MED ORDER — 0.9 % SODIUM CHLORIDE (POUR BTL) OPTIME
TOPICAL | Status: DC | PRN
  Administered 2013-06-02: 1000 mL

## 2013-06-02 MED ORDER — METOCLOPRAMIDE HCL 10 MG PO TABS: 5.0000 mg | ORAL_TABLET | Freq: Three times a day (TID) | ORAL | Status: DC | PRN

## 2013-06-02 MED ORDER — CEFAZOLIN SODIUM-DEXTROSE 2-3 GM-% IV SOLR
INTRAVENOUS | Status: AC
  Filled 2013-06-02: qty 50

## 2013-06-02 MED ORDER — DOCUSATE SODIUM 100 MG PO CAPS
100.0000 mg | ORAL_CAPSULE | Freq: Two times a day (BID) | ORAL | Status: DC
  Administered 2013-06-02 – 2013-06-04 (×5): 100 mg via ORAL
  Filled 2013-06-02: qty 1

## 2013-06-02 MED ORDER — DIPHENHYDRAMINE HCL 12.5 MG/5ML PO ELIX: 12.5000 mg | ORAL_SOLUTION | ORAL | Status: DC | PRN

## 2013-06-02 MED ORDER — ACETAMINOPHEN 325 MG PO TABS
650.0000 mg | ORAL_TABLET | Freq: Four times a day (QID) | ORAL | Status: DC | PRN
  Administered 2013-06-03 – 2013-06-04 (×3): 650 mg via ORAL
  Filled 2013-06-02 (×3): qty 2

## 2013-06-02 MED ORDER — LIDOCAINE HCL (CARDIAC) 20 MG/ML IV SOLN
INTRAVENOUS | Status: AC
  Filled 2013-06-02: qty 5

## 2013-06-02 MED ORDER — ONDANSETRON HCL 4 MG PO TABS
4.0000 mg | ORAL_TABLET | Freq: Four times a day (QID) | ORAL | Status: DC | PRN
  Administered 2013-06-03: 4 mg via ORAL
  Filled 2013-06-02: qty 1

## 2013-06-02 MED ORDER — ZOLPIDEM TARTRATE 5 MG PO TABS: 5.0000 mg | ORAL_TABLET | Freq: Every evening | ORAL | Status: DC | PRN

## 2013-06-02 MED ORDER — SUCCINYLCHOLINE CHLORIDE 20 MG/ML IJ SOLN
INTRAMUSCULAR | Status: AC
  Filled 2013-06-02: qty 1

## 2013-06-02 MED ORDER — HYDROMORPHONE HCL PF 1 MG/ML IJ SOLN
1.0000 mg | INTRAMUSCULAR | Status: DC | PRN
  Administered 2013-06-02: 0.5 mg via INTRAVENOUS
  Filled 2013-06-02: qty 1

## 2013-06-02 MED ORDER — ACETAMINOPHEN 650 MG RE SUPP: 650.0000 mg | Freq: Four times a day (QID) | RECTAL | Status: DC | PRN

## 2013-06-02 MED ORDER — NEOSTIGMINE METHYLSULFATE 1 MG/ML IJ SOLN
INTRAMUSCULAR | Status: AC
  Filled 2013-06-02: qty 10

## 2013-06-02 MED ORDER — PHENOL 1.4 % MT LIQD: 1.0000 | OROMUCOSAL | Status: DC | PRN

## 2013-06-02 MED ORDER — SUCCINYLCHOLINE CHLORIDE 20 MG/ML IJ SOLN
INTRAMUSCULAR | Status: DC | PRN
  Administered 2013-06-02: 70 mg via INTRAVENOUS

## 2013-06-02 MED ORDER — HYDROMORPHONE HCL PF 1 MG/ML IJ SOLN
INTRAMUSCULAR | Status: AC
  Filled 2013-06-02: qty 1

## 2013-06-02 MED ORDER — METOCLOPRAMIDE HCL 5 MG/ML IJ SOLN
5.0000 mg | Freq: Three times a day (TID) | INTRAMUSCULAR | Status: DC | PRN
  Administered 2013-06-02: 10 mg via INTRAVENOUS
  Filled 2013-06-02: qty 2

## 2013-06-02 MED ORDER — DEXAMETHASONE SODIUM PHOSPHATE 10 MG/ML IJ SOLN
INTRAMUSCULAR | Status: DC | PRN
  Administered 2013-06-02: 10 mg via INTRAVENOUS

## 2013-06-02 MED ORDER — EPHEDRINE SULFATE 50 MG/ML IJ SOLN
INTRAMUSCULAR | Status: AC
  Filled 2013-06-02: qty 1

## 2013-06-02 MED ORDER — ONDANSETRON HCL 4 MG/2ML IJ SOLN: 4.0000 mg | Freq: Four times a day (QID) | INTRAMUSCULAR | Status: DC | PRN

## 2013-06-02 MED ORDER — ALUM & MAG HYDROXIDE-SIMETH 200-200-20 MG/5ML PO SUSP
30.0000 mL | ORAL | Status: DC | PRN
  Administered 2013-06-03: 30 mL via ORAL
  Filled 2013-06-02: qty 30

## 2013-06-02 MED ORDER — HYDROMORPHONE HCL PF 2 MG/ML IJ SOLN
INTRAMUSCULAR | Status: AC
  Filled 2013-06-02: qty 1

## 2013-06-02 MED ORDER — SODIUM CHLORIDE 0.9 % IV SOLN
INTRAVENOUS | Status: DC
  Administered 2013-06-02: 75 mL/h via INTRAVENOUS
  Administered 2013-06-03: 02:00:00 via INTRAVENOUS

## 2013-06-02 MED ORDER — SODIUM CHLORIDE 0.9 % IR SOLN
Status: DC | PRN
  Administered 2013-06-02: 1000 mL

## 2013-06-02 MED ORDER — FENTANYL CITRATE 0.05 MG/ML IJ SOLN
INTRAMUSCULAR | Status: AC
  Filled 2013-06-02: qty 5

## 2013-06-02 MED ORDER — CEFAZOLIN SODIUM-DEXTROSE 2-3 GM-% IV SOLR
2.0000 g | INTRAVENOUS | Status: AC
  Administered 2013-06-02: 2 g via INTRAVENOUS

## 2013-06-02 MED ORDER — NEOSTIGMINE METHYLSULFATE 1 MG/ML IJ SOLN
INTRAMUSCULAR | Status: DC | PRN
  Administered 2013-06-02: 5 mg via INTRAVENOUS

## 2013-06-02 MED ORDER — SODIUM CHLORIDE 0.9 % IJ SOLN
INTRAMUSCULAR | Status: AC
  Filled 2013-06-02: qty 10

## 2013-06-02 MED ORDER — LACTATED RINGERS IV SOLN
INTRAVENOUS | Status: DC | PRN
  Administered 2013-06-02 (×2): via INTRAVENOUS

## 2013-06-02 MED ORDER — PHENYLEPHRINE 40 MCG/ML (10ML) SYRINGE FOR IV PUSH (FOR BLOOD PRESSURE SUPPORT)
PREFILLED_SYRINGE | INTRAVENOUS | Status: AC
  Filled 2013-06-02: qty 10

## 2013-06-02 MED ORDER — POLYETHYLENE GLYCOL 3350 17 G PO PACK
17.0000 g | PACK | Freq: Every day | ORAL | Status: DC | PRN
  Filled 2013-06-02: qty 1

## 2013-06-02 MED ORDER — METHOCARBAMOL 500 MG PO TABS
500.0000 mg | ORAL_TABLET | Freq: Four times a day (QID) | ORAL | Status: DC | PRN
  Administered 2013-06-02 – 2013-06-03 (×3): 500 mg via ORAL
  Filled 2013-06-02 (×3): qty 1

## 2013-06-02 MED ORDER — PROPOFOL 10 MG/ML IV BOLUS
INTRAVENOUS | Status: AC
  Filled 2013-06-02: qty 20

## 2013-06-02 MED ORDER — GLYCOPYRROLATE 0.2 MG/ML IJ SOLN
INTRAMUSCULAR | Status: DC | PRN
  Administered 2013-06-02: .6 mg via INTRAVENOUS

## 2013-06-02 MED ORDER — MIDAZOLAM HCL 5 MG/5ML IJ SOLN
INTRAMUSCULAR | Status: DC | PRN
  Administered 2013-06-02: 2 mg via INTRAVENOUS

## 2013-06-02 MED ORDER — ONDANSETRON HCL 4 MG/2ML IJ SOLN
INTRAMUSCULAR | Status: AC
  Filled 2013-06-02: qty 2

## 2013-06-02 MED ORDER — FENTANYL CITRATE 0.05 MG/ML IJ SOLN
INTRAMUSCULAR | Status: DC | PRN
  Administered 2013-06-02 (×5): 50 ug via INTRAVENOUS

## 2013-06-02 MED ORDER — GLYCOPYRROLATE 0.2 MG/ML IJ SOLN
INTRAMUSCULAR | Status: AC
  Filled 2013-06-02: qty 3

## 2013-06-02 MED ORDER — MENTHOL 3 MG MT LOZG: 1.0000 | LOZENGE | OROMUCOSAL | Status: DC | PRN

## 2013-06-02 MED ORDER — EPHEDRINE SULFATE 50 MG/ML IJ SOLN
INTRAMUSCULAR | Status: DC | PRN
  Administered 2013-06-02: 10 mg via INTRAVENOUS

## 2013-06-02 MED ORDER — LIDOCAINE HCL (CARDIAC) 20 MG/ML IV SOLN
INTRAVENOUS | Status: DC | PRN
  Administered 2013-06-02: 100 mg via INTRAVENOUS

## 2013-06-02 MED ORDER — HYDROMORPHONE HCL PF 1 MG/ML IJ SOLN
INTRAMUSCULAR | Status: DC | PRN
  Administered 2013-06-02 (×2): 0.5 mg via INTRAVENOUS

## 2013-06-02 MED ORDER — HYDROMORPHONE HCL PF 1 MG/ML IJ SOLN
INTRAMUSCULAR | Status: AC
  Administered 2013-06-02: 0.5 mg via INTRAVENOUS
  Filled 2013-06-02: qty 1

## 2013-06-02 MED ORDER — DEXAMETHASONE SODIUM PHOSPHATE 10 MG/ML IJ SOLN
INTRAMUSCULAR | Status: AC
  Filled 2013-06-02: qty 6

## 2013-06-02 MED ORDER — PHENYLEPHRINE HCL 10 MG/ML IJ SOLN
INTRAMUSCULAR | Status: DC | PRN
  Administered 2013-06-02 (×2): 40 ug via INTRAVENOUS

## 2013-06-02 MED ORDER — OXYCODONE HCL 5 MG PO TABS
5.0000 mg | ORAL_TABLET | ORAL | Status: DC | PRN
  Administered 2013-06-02: 5 mg via ORAL
  Administered 2013-06-02: 10 mg via ORAL
  Administered 2013-06-02: 5 mg via ORAL
  Administered 2013-06-02 – 2013-06-03 (×4): 10 mg via ORAL
  Administered 2013-06-03: 5 mg via ORAL
  Filled 2013-06-02 (×3): qty 2
  Filled 2013-06-02 (×2): qty 1
  Filled 2013-06-02 (×3): qty 2

## 2013-06-02 MED ORDER — HYDROMORPHONE HCL PF 1 MG/ML IJ SOLN
0.2500 mg | INTRAMUSCULAR | Status: DC | PRN
  Administered 2013-06-02 (×4): 0.5 mg via INTRAVENOUS

## 2013-06-02 MED ORDER — ROCURONIUM BROMIDE 100 MG/10ML IV SOLN
INTRAVENOUS | Status: DC | PRN
  Administered 2013-06-02: 30 mg via INTRAVENOUS
  Administered 2013-06-02 (×2): 10 mg via INTRAVENOUS

## 2013-06-02 MED ORDER — CEFAZOLIN SODIUM 1-5 GM-% IV SOLN
1.0000 g | Freq: Four times a day (QID) | INTRAVENOUS | Status: AC
  Administered 2013-06-02 (×2): 1 g via INTRAVENOUS
  Filled 2013-06-02 (×2): qty 50

## 2013-06-02 MED ORDER — MIDAZOLAM HCL 2 MG/2ML IJ SOLN
INTRAMUSCULAR | Status: AC
  Filled 2013-06-02: qty 2

## 2013-06-02 MED ORDER — OXYCODONE HCL ER 10 MG PO T12A
10.0000 mg | EXTENDED_RELEASE_TABLET | Freq: Two times a day (BID) | ORAL | Status: DC
  Administered 2013-06-02 – 2013-06-04 (×5): 10 mg via ORAL
  Filled 2013-06-02 (×5): qty 1

## 2013-06-02 MED ORDER — PROPOFOL 10 MG/ML IV BOLUS
INTRAVENOUS | Status: DC | PRN
  Administered 2013-06-02: 140 mg via INTRAVENOUS

## 2013-06-02 MED ORDER — ROCURONIUM BROMIDE 100 MG/10ML IV SOLN
INTRAVENOUS | Status: AC
  Filled 2013-06-02: qty 1

## 2013-06-02 MED ORDER — METHOCARBAMOL 100 MG/ML IJ SOLN
500.0000 mg | Freq: Four times a day (QID) | INTRAMUSCULAR | Status: DC | PRN
  Administered 2013-06-02: 500 mg via INTRAVENOUS
  Filled 2013-06-02: qty 5

## 2013-06-02 SURGICAL SUPPLY — 46 items
APL SKNCLS STERI-STRIP NONHPOA (GAUZE/BANDAGES/DRESSINGS) ×1
BENZOIN TINCTURE PRP APPL 2/3 (GAUZE/BANDAGES/DRESSINGS) ×2 IMPLANT
BLADE SAW SGTL 18X1.27X75 (BLADE) ×2 IMPLANT
BLADE SAW SGTL 18X1.27X75MM (BLADE) ×1
CAPT HIP PF COP ×2 IMPLANT
CELLS DAT CNTRL 66122 CELL SVR (MISCELLANEOUS) ×1 IMPLANT
CLOSURE WOUND 1/2 X4 (GAUZE/BANDAGES/DRESSINGS) ×1
DRAPE C-ARM 42X120 X-RAY (DRAPES) ×3 IMPLANT
DRAPE STERI IOBAN 125X83 (DRAPES) ×3 IMPLANT
DRAPE U-SHAPE 47X51 STRL (DRAPES) ×9 IMPLANT
DRSG AQUACEL AG ADV 3.5X10 (GAUZE/BANDAGES/DRESSINGS) ×3 IMPLANT
DURAPREP 26ML APPLICATOR (WOUND CARE) ×3 IMPLANT
ELECT BLADE TIP CTD 4 INCH (ELECTRODE) ×3 IMPLANT
ELECT REM PT RETURN 9FT ADLT (ELECTROSURGICAL) ×3
ELECTRODE REM PT RTRN 9FT ADLT (ELECTROSURGICAL) ×1 IMPLANT
FACESHIELD LNG OPTICON STERILE (SAFETY) ×12 IMPLANT
GAUZE XEROFORM 5X9 LF (GAUZE/BANDAGES/DRESSINGS) ×1 IMPLANT
GLOVE BIO SURGEON STRL SZ7.5 (GLOVE) ×6 IMPLANT
GLOVE BIOGEL PI IND STRL 7.0 (GLOVE) IMPLANT
GLOVE BIOGEL PI IND STRL 7.5 (GLOVE) IMPLANT
GLOVE BIOGEL PI IND STRL 8 (GLOVE) ×2 IMPLANT
GLOVE BIOGEL PI INDICATOR 7.0 (GLOVE) ×2
GLOVE BIOGEL PI INDICATOR 7.5 (GLOVE) ×6
GLOVE BIOGEL PI INDICATOR 8 (GLOVE) ×4
GLOVE ECLIPSE 8.0 STRL XLNG CF (GLOVE) ×3 IMPLANT
GOWN STRL REIN 2XL XLG LVL4 (GOWN DISPOSABLE) ×2 IMPLANT
GOWN STRL REIN XL XLG (GOWN DISPOSABLE) ×6 IMPLANT
HANDPIECE INTERPULSE COAX TIP (DISPOSABLE) ×3
KIT BASIN OR (CUSTOM PROCEDURE TRAY) ×3 IMPLANT
PACK TOTAL JOINT (CUSTOM PROCEDURE TRAY) ×3 IMPLANT
PADDING CAST COTTON 6X4 STRL (CAST SUPPLIES) ×3 IMPLANT
RETRACTOR WND ALEXIS 18 MED (MISCELLANEOUS) ×1 IMPLANT
RTRCTR WOUND ALEXIS 18CM MED (MISCELLANEOUS) ×3
SET HNDPC FAN SPRY TIP SCT (DISPOSABLE) ×1 IMPLANT
STAPLER VISISTAT 35W (STAPLE) ×1 IMPLANT
STRIP CLOSURE SKIN 1/2X4 (GAUZE/BANDAGES/DRESSINGS) ×1 IMPLANT
SUT ETHIBOND NAB CT1 #1 30IN (SUTURE) ×3 IMPLANT
SUT ETHILON 3 0 PS 1 (SUTURE) IMPLANT
SUT MNCRL AB 4-0 PS2 18 (SUTURE) ×2 IMPLANT
SUT VIC AB 0 CT1 36 (SUTURE) ×3 IMPLANT
SUT VIC AB 1 CT1 36 (SUTURE) ×3 IMPLANT
SUT VIC AB 2-0 CT1 27 (SUTURE) ×6
SUT VIC AB 2-0 CT1 TAPERPNT 27 (SUTURE) ×1 IMPLANT
TOWEL OR 17X26 10 PK STRL BLUE (TOWEL DISPOSABLE) ×3 IMPLANT
TOWEL OR NON WOVEN STRL DISP B (DISPOSABLE) ×3 IMPLANT
TRAY FOLEY CATH 14FRSI W/METER (CATHETERS) ×3 IMPLANT

## 2013-06-02 NOTE — Anesthesia Postprocedure Evaluation (Signed)
  Anesthesia Post-op Note  Patient: Beth Mcdowell  Procedure(s) Performed: Procedure(s) (LRB): RIGHT TOTAL HIP ARTHROPLASTY ANTERIOR APPROACH (Right)  Patient Location: PACU  Anesthesia Type: General  Level of Consciousness: awake and alert   Airway and Oxygen Therapy: Patient Spontanous Breathing  Post-op Pain: mild  Post-op Assessment: Post-op Vital signs reviewed, Patient's Cardiovascular Status Stable, Respiratory Function Stable, Patent Airway and No signs of Nausea or vomiting  Last Vitals:  Filed Vitals:   06/02/13 1200  BP: 113/64  Pulse: 68  Temp: 36.3 C  Resp: 12    Post-op Vital Signs: stable   Complications: No apparent anesthesia complications

## 2013-06-02 NOTE — Brief Op Note (Signed)
06/02/2013  9:20 AM  PATIENT:  Beth Mcdowell  62 y.o. female  PRE-OPERATIVE DIAGNOSIS:  Right hip osteoarthritis  POST-OPERATIVE DIAGNOSIS:  Right hip osteoarthritis  PROCEDURE:  Procedure(s): RIGHT TOTAL HIP ARTHROPLASTY ANTERIOR APPROACH (Right)  SURGEON:  Surgeon(s) and Role:    * Kathryne Hitch, MD - Primary  PHYSICIAN ASSISTANT: Rexene Edison, PA-C  ANESTHESIA:   general  EBL:  Total I/O In: 1000 [I.V.:1000] Out: 490 [Urine:290; Blood:200]  BLOOD ADMINISTERED:none  DRAINS: none   LOCAL MEDICATIONS USED:  NONE  SPECIMEN:  No Specimen  DISPOSITION OF SPECIMEN:  N/A  COUNTS:  YES  TOURNIQUET:  * No tourniquets in log *  DICTATION: .Other Dictation: Dictation Number [id number]  PLAN OF CARE: Admit to inpatient   PATIENT DISPOSITION:  PACU - hemodynamically stable.   Delay start of Pharmacological VTE agent (>24hrs) due to surgical blood loss or risk of bleeding: no

## 2013-06-02 NOTE — Evaluation (Signed)
Physical Therapy Evaluation Patient Details Name: Beth Mcdowell MRN: [id number] DOB: 06-29-1951 Today's Date: 06/02/2013 Time: 7829-5621 PT Time Calculation (min): 37 min  PT Assessment / Plan / Recommendation History of Present Illness     Clinical Impression  Pt s/p R THR presents with decreased R LE strength/ROM, post op pain and orthostatic BP with OOB activity limiting functional mobility.  Pt should progress to d/c home with family assist and HHPT follow up    PT Assessment  Patient needs continued PT services    Follow Up Recommendations  Home health PT    Does the patient have the potential to tolerate intense rehabilitation      Barriers to Discharge        Equipment Recommendations  None recommended by PT    Recommendations for Other Services OT consult   Frequency 7X/week    Precautions / Restrictions Precautions Precautions: Fall Precaution Comments: Pt states she passes out very easily and to expect same Restrictions Weight Bearing Restrictions: No Other Position/Activity Restrictions: WBAT   Pertinent Vitals/Pain 4/10; RN aware and providing meds, ice pack provided      Mobility  Bed Mobility Bed Mobility: Supine to Sit;Sit to Supine Supine to Sit: 4: Min assist;3: Mod assist Sit to Supine: 3: Mod assist Details for Bed Mobility Assistance: cues for sequence and use of L LE to self assist Transfers Transfers: Sit to Stand;Stand to Sit Sit to Stand: 1: +2 Total assist Sit to Stand: Patient Percentage: 70% Stand to Sit: 1: +2 Total assist Stand to Sit: Patient Percentage: 70% Details for Transfer Assistance: cues for LE management and use of UEs to self assist Ambulation/Gait Ambulation/Gait Assistance: 1: +2 Total assist Ambulation/Gait: Patient Percentage: 70% Ambulation Distance (Feet): 5 Feet Assistive device: Rolling walker Ambulation/Gait Assistance Details: Pt side stepped up side of bed.   Gait Pattern: Step-to pattern;Decreased step  length - right;Decreased step length - left;Shuffle General Gait Details: Ltd by declining BP - recorded by RN Stairs: No    Exercises Total Joint Exercises Ankle Circles/Pumps: AROM;15 reps;Supine;Both Quad Sets: AROM;10 reps;Supine;Both Heel Slides: AAROM;20 reps;Supine;Right Hip ABduction/ADduction: AAROM;Right;15 reps;Supine   PT Diagnosis: Difficulty walking  PT Problem List: Decreased strength;Decreased range of motion;Decreased activity tolerance;Decreased mobility;Decreased knowledge of use of DME;Pain PT Treatment Interventions: DME instruction;Gait training;Stair training;Functional mobility training;Therapeutic activities;Therapeutic exercise;Patient/family education     PT Goals(Current goals can be found in the care plan section) Acute Rehab PT Goals Patient Stated Goal: REsume previous lifestyle with decreased pain PT Goal Formulation: With patient Time For Goal Achievement: 06/08/13 Potential to Achieve Goals: Good  Visit Information  Last PT Received On: 06/02/13 Assistance Needed: +2 (Pt orthostatic)       Prior Functioning  Home Living Family/patient expects to be discharged to:: Private residence Living Arrangements: Spouse/significant other Available Help at Discharge: Family Type of Home: House Home Access: Stairs to enter Secretary/administrator of Steps: 1+1 Entrance Stairs-Rails: None Home Layout: Two level Alternate Level Stairs-Number of Steps: 16 Alternate Level Stairs-Rails: Right Home Equipment: Walker - 2 wheels;Bedside commode Additional Comments: Plans to borrow equipment Prior Function Level of Independence: Independent Communication Communication: No difficulties Dominant Hand: Right    Cognition  Cognition Arousal/Alertness: Awake/alert Behavior During Therapy: WFL for tasks assessed/performed Overall Cognitive Status: Within Functional Limits for tasks assessed    Extremity/Trunk Assessment Upper Extremity Assessment Upper  Extremity Assessment: Overall WFL for tasks assessed Lower Extremity Assessment Lower Extremity Assessment: RLE deficits/detail;LLE deficits/detail RLE Deficits / Details: 2/5 hip strength  with AAROM at hip to 80 flex and 15 abd LLE Deficits / Details: ltd abd Cervical / Trunk Assessment Cervical / Trunk Assessment: Normal   Balance    End of Session PT - End of Session Equipment Utilized During Treatment: Gait belt Activity Tolerance: Other (comment) (ltd by delining BP) Patient left: in bed;with call bell/phone within reach;with family/visitor present;with nursing/sitter in room Nurse Communication: Mobility status  GP     Beth Mcdowell 06/02/2013, 5:26 PM

## 2013-06-02 NOTE — H&P (Signed)
TOTAL HIP ADMISSION H&P  Patient is admitted for right total hip arthroplasty.  Subjective:  Chief Complaint: right hip pain  HPI: Beth Mcdowell, 62 y.o. female, has a history of pain and functional disability in the right hip(s) due to arthritis and patient has failed non-surgical conservative treatments for greater than 12 weeks to include NSAID's and/or analgesics and activity modification.  Onset of symptoms was gradual starting 4 years ago with gradually worsening course since that time.The patient noted no past surgery on the right hip(s).  Patient currently rates pain in the right hip at 10 out of 10 with activity. Patient has night pain, worsening of pain with activity and weight bearing, trendelenberg gait, pain that interfers with activities of daily living and pain with passive range of motion. Patient has evidence of subchondral cysts, subchondral sclerosis, periarticular osteophytes and joint space narrowing by imaging studies. This condition presents safety issues increasing the risk of falls.  There is no current active infection.  Patient Active Problem List   Diagnosis Date Noted  . Degenerative arthritis of right hip 06/02/2013   Past Medical History  Diagnosis Date  . Thyroid disease 1980    nodule- treated w/ synthroid for a couple of years  . Arthritis     hips  . Spinal stenosis     Hx: of  . Muscle spasm     SPASMS BOTH LEGS AND FEET -FROM LUMBAR STENOSIS  . Anemia     SOMETIMES    Past Surgical History  Procedure Laterality Date  . X-stop implantation  02/2012  . Ovarian cyst removal  1977, 1980  . Back surgery      Hx: of lumbar  . Dilation and curettage of uterus    . Colonoscopy      Hx: of  . Breast biopsy      Hx: of left breast 2/ 2014  . Breast biopsy Left 02/20/2013    Procedure: Left breast wire guided excision;  Surgeon: Emelia Loron, MD;  Location: [facility] OR;  Service: General;  Laterality: Left;    Prescriptions prior to admission  Medication  Sig Dispense Refill  . gabapentin (NEURONTIN) 100 MG capsule Take 100 mg by mouth once as needed (pain). For pain      . HYDROcodone-acetaminophen (NORCO/VICODIN) 5-325 MG per tablet Take 1-2 tablets by mouth every 6 (six) hours as needed for moderate pain. PRESCRIP SAYS 1 TO 2 TABS EVERY 4 TO 6HRS IF NEEDED FOR PAIN      . Ibuprofen-Diphenhydramine Cit (ADVIL PM PO) Take 1 tablet by mouth every 6 (six) hours as needed (sleep).       . methocarbamol (ROBAXIN) 500 MG tablet Take 500 mg by mouth 3 (three) times daily as needed for muscle spasms.      . naproxen (NAPROSYN) 500 MG tablet Take 500 mg by mouth 2 (two) times daily with a meal.       Allergies  Allergen Reactions  . Erythromycin     GI upset... Pt stays away from all mycins    History  Substance Use Topics  . Smoking status: Never Smoker   . Smokeless tobacco: Never Used  . Alcohol Use: Yes     Comment: 3 glasses/week    Family History  Problem Relation Age of Onset  . Heart disease Mother   . Heart disease Father   . Cancer Brother   . Hypertension Other      Review of Systems  Musculoskeletal: Positive for back  pain and joint pain.  All other systems reviewed and are negative.    Objective:  Physical Exam  Constitutional: She is oriented to person, place, and time. She appears well-developed and well-nourished.  HENT:  Head: Normocephalic and atraumatic.  Eyes: EOM are normal. Pupils are equal, round, and reactive to light.  Neck: Normal range of motion. Neck supple.  Cardiovascular: Normal rate and regular rhythm.   Respiratory: Effort normal and breath sounds normal.  GI: Soft. Bowel sounds are normal.  Musculoskeletal:       Right hip: She exhibits decreased range of motion, decreased strength and bony tenderness.  Neurological: She is alert and oriented to person, place, and time.  Skin: Skin is warm and dry.  Psychiatric: She has a normal mood and affect.    Vital signs in last 24 hours: Temp:   [97.7 F (36.5 C)] 97.7 F (36.5 C) (12/05 0533) Pulse Rate:  [84] 84 (12/05 0533) Resp:  [18] 18 (12/05 0533) BP: (113)/(67) 113/67 mmHg (12/05 0533) SpO2:  [99 %] 99 % (12/05 0533)  Labs:   Estimated body mass index is 23.09 kg/(m^2) as calculated from the following:   Height as of 05/23/13: 5\' 8"  (1.727 m).   Weight as of 03/07/13: 68.856 kg (151 lb 12.8 oz).   Imaging Review Plain radiographs demonstrate severe degenerative joint disease of the right hip(s). The bone quality appears to be good for age and reported activity level.  Assessment/Plan:  End stage arthritis, right hip(s)  The patient history, physical examination, clinical judgement of the provider and imaging studies are consistent with end stage degenerative joint disease of the right hip(s) and total hip arthroplasty is deemed medically necessary. The treatment options including medical management, injection therapy, arthroscopy and arthroplasty were discussed at length. The risks and benefits of total hip arthroplasty were presented and reviewed. The risks due to aseptic loosening, infection, stiffness, dislocation/subluxation,  thromboembolic complications and other imponderables were discussed.  The patient acknowledged the explanation, agreed to proceed with the plan and consent was signed. Patient is being admitted for inpatient treatment for surgery, pain control, PT, OT, prophylactic antibiotics, VTE prophylaxis, progressive ambulation and ADL's and discharge planning.The patient is planning to be discharged home with home health services

## 2013-06-02 NOTE — Transfer of Care (Signed)
Immediate Anesthesia Transfer of Care Note  Patient: Beth Mcdowell  Procedure(s) Performed: Procedure(s) (LRB): RIGHT TOTAL HIP ARTHROPLASTY ANTERIOR APPROACH (Right)  Patient Location: PACU  Anesthesia Type: General  Level of Consciousness: sedated, patient cooperative and responds to stimulation  Airway & Oxygen Therapy: Patient Spontanous Breathing and Patient connected to face mask oxgen  Post-op Assessment: Report given to PACU RN and Post -op Vital signs reviewed and stable  Post vital signs: Reviewed and stable  Complications: No apparent anesthesia complications

## 2013-06-02 NOTE — Preoperative (Signed)
Beta Blockers   Reason not to administer Beta Blockers:Not Applicable 

## 2013-06-02 NOTE — Progress Notes (Signed)
Utilization review completed.  

## 2013-06-03 LAB — CBC
MCH: 29.2 pg (ref 26.0–34.0)
MCHC: 33.1 g/dL (ref 30.0–36.0)
MCV: 88.4 fL (ref 78.0–100.0)
Platelets: 195 10*3/uL (ref 150–400)
RDW: 12.6 % (ref 11.5–15.5)

## 2013-06-03 LAB — BASIC METABOLIC PANEL
CO2: 25 mEq/L (ref 19–32)
Calcium: 8.8 mg/dL (ref 8.4–10.5)
Creatinine, Ser: 0.87 mg/dL (ref 0.50–1.10)
GFR calc Af Amer: 80 mL/min — ABNORMAL LOW (ref >90)
GFR calc non Af Amer: 69 mL/min — ABNORMAL LOW (ref >90)
Sodium: 138 mEq/L (ref 135–145)

## 2013-06-03 MED ORDER — GABAPENTIN 100 MG PO CAPS
100.0000 mg | ORAL_CAPSULE | Freq: Two times a day (BID) | ORAL | Status: DC
  Administered 2013-06-03 – 2013-06-04 (×3): 100 mg via ORAL
  Filled 2013-06-03 (×4): qty 1

## 2013-06-03 MED ORDER — CYCLOBENZAPRINE HCL 10 MG PO TABS
10.0000 mg | ORAL_TABLET | Freq: Three times a day (TID) | ORAL | Status: DC | PRN
  Administered 2013-06-03 – 2013-06-04 (×4): 10 mg via ORAL
  Filled 2013-06-03 (×5): qty 1

## 2013-06-03 NOTE — Progress Notes (Signed)
Subjective: 1 Day Post-Op Procedure(s) (LRB): RIGHT TOTAL HIP ARTHROPLASTY ANTERIOR APPROACH (Right) Patient reports pain as moderate.  Reports significant spasms.  Asymptomatic acute blood loss anemia from surgery.  Objective: Vital signs in last 24 hours: Temp:  [97.4 F (36.3 C)-98.7 F (37.1 C)] 98.6 F (37 C) (12/06 0636) Pulse Rate:  [60-78] 78 (12/06 0636) Resp:  [11-16] 16 (12/06 0636) BP: (98-142)/(52-74) 105/63 mmHg (12/06 0636) SpO2:  [99 %-100 %] 100 % (12/06 0636) Weight:  [69.854 kg (154 lb)] 69.854 kg (154 lb) (12/05 1059)  Intake/Output from previous day: 12/05 0701 - 12/06 0700 In: 4856.3 [P.O.:960; I.V.:3686.3; IV Piggyback:210] Out: 4050 [Urine:3850; Blood:200] Intake/Output this shift:     Recent Labs  06/03/13 0518  HGB 9.3*    Recent Labs  06/03/13 0518  WBC 10.2  RBC 3.18*  HCT 28.1*  PLT 195    Recent Labs  06/03/13 0518  NA 138  K 4.1  CL 106  CO2 25  BUN 15  CREATININE 0.87  GLUCOSE 108*  CALCIUM 8.8   No results found for this basename: LABPT, INR,  in the last 72 hours  Sensation intact distally Intact pulses distally Dorsiflexion/Plantar flexion intact Incision: scant drainage  Assessment/Plan: 1 Day Post-Op Procedure(s) (LRB): RIGHT TOTAL HIP ARTHROPLASTY ANTERIOR APPROACH (Right) Up with therapy Try flexeril and neurontin. Likely d/c Monday  Ramon Brant Y 06/03/2013, 10:20 AM

## 2013-06-03 NOTE — Op Note (Signed)
Beth Mcdowell, Beth Mcdowell                   ACCOUNT NO.:  [id number]  MEDICAL RECORD NO.:  [id number]  LOCATION:  1619                         FACILITY:  [facility]  PHYSICIAN:  Vanita Panda. Magnus Ivan, M.D.DATE OF BIRTH:  October 10, 1950  DATE OF PROCEDURE:  06/02/2013 DATE OF DISCHARGE:                              OPERATIVE REPORT   PREOPERATIVE DIAGNOSES:  Severe end-stage arthritis and degenerative joint disease, right hip.  POSTOPERATIVE DIAGNOSES:  Severe end-stage arthritis and degenerative joint disease, right hip.  PROCEDURE:  Right total hip arthroplasty through direct anterior approach.  SURGEON:  Vanita Panda. Magnus Ivan, M.D.  ASSISTANT:  Richardean Canal, PA-C  ANESTHESIA:  General.  IMPLANTS:  DePuy Sector Gription acetabular component, size 52, size 32+ 4 neutral polyethylene liner, size 10 Corail femoral component with standard offset, size 32+ 9 ceramic hip ball.  ANTIBIOTICS:  2 g of IV Ancef.  BLOOD LOSS:  200 mL.  COMPLICATIONS:  None.  INDICATIONS:  Beth Mcdowell is a 62 year old female with debilitating end- stage arthritis of her right hip.  This has been verified by x-rays and clinical exam.  She has complete loss of her joint space, subchondral sclerosis, cystic changes, and periarticular osteophytes.  Her mobility has become limited.  Pain is daily.  Her quality of life has diminished. At this point, she wished to proceed with a total hip arthroplasty.  She understands the risks of acute blood loss anemia, nerve and vessel injury, fracture, infection, and DVT.  She also understands that the goals are decreased pain, improved functional mobility and overall improved quality of life.  PROCEDURE DESCRIPTION:  After informed consent was obtained and appropriate right hip was marked, she was brought to the operating room and general anesthesia was obtained while she was on her stretcher.  A Foley catheter was placed and then traction boots were placed on both her feet.   Next, she was placed supine on the Hana fracture table with perineal post in place and both legs in inline skeletal traction, but no traction applied.  Her right operative hip was then assessed radiographically, so we could obtain center of the pelvis for assessing leg lengths.  We then prepped the right hip with DuraPrep and sterile drapes including sterile drapes on the C-arm.  A time-out was called and she was identified as correct patient and correct right hip.  I then made an incision just inferior and posterior to the anterior superior iliac spine and carried this obliquely down the leg.  I dissected down to the tensor fascia lata muscle and the tensor fascia was divided longitudinally.  I then proceeded with a direct anterior approach to the hip.  We cauterized the lateral femoral circumflex vessels and then put Cobra retractors around the femoral neck medially and laterally.  We opened up the hip capsule and found the large effusion.  We placed the Cobra retractors within the hip capsule.  I then made my femoral neck cut with an oscillating saw just proximal to the lesser trochanter and completed this with an osteotome.  We placed a corkscrew guide in the femoral head and removed the femoral head in its entirety and found it to be  completely devoid of cartilage.  We then cleaned the acetabulum for remnants of the labrum and tissue deep in the acetabulum.  We cleaned the periarticular osteophytes around the acetabular rim and placed a bent Hohmann medially and a Cobra retractor laterally.  I then began reaming from a size 42 reamer in 2-mm increments up to a size 50 with all reamers under direct visualization.  The last reamer also under direct fluoroscopy.  So, we could obtain our depth of reaming, our inclination, and anteversion.  Once I was pleased with this, I placed the real DePuy Sector Gription acetabular component, size 50 and the apex hole eliminator guide and a 32+ 4  neutral polyethylene liner. Attention was then turned to the femur with the leg externally rotated to 100 degrees, extended and adducted.  I placed a Mueller retractor medially and a Hohmann retractor behind the greater trochanter.  I released the lateral joint capsule and then used a box cutting osteotome to the femoral canal and a rongeur to lateralize.  I began broaching from a size 8 broach using the Corail broaching system up to a size 10 and trialed a standard neck and a 32+ 1 hip ball.  We brought the leg back over and up with traction and internal rotation, reduced into the pelvis, but I felt like we needed significantly more length.  We then dislocated the hip and removed the trial components.  I placed the real Corail femoral component and then trailed a 32+ 9 hip ball.  We brought the leg back over and up with traction and internal rotation, and reduced into the pelvis and I was pleased with her stability through internal and external rotation, as well as minimal shuck.  Her leg lengths were measured near equal.  We then dislocated the hip and removed the trial ball and placed the real 32+ 9 ceramic hip ball and again placed it back in the acetabulum, was stable.  We then copiously irrigated the soft tissue with normal saline solution using pulsatile lavage.  We closed the joint capsule with interrupted #1 Ethibond suture followed by running #1 Vicryl in the tensor fascia, 0 Vicryl in the deep tissue, 2-0 Vicryl in the subcutaneous tissue, 4-0 Monocryl subcuticular stitch, and Steri-Strips and an Aquacel dressing.  She was then taken off the Hana table, awakened, extubated and taken to the recovery room in stable condition.  All final counts were correct.  There were no complications noted.  Of note, Richardean Canal, PA-C was present the entire case and his presence was crucial for getting the case completed.     Vanita Panda. Magnus Ivan, M.D.     CYB/MEDQ  D:  06/02/2013   T:  06/03/2013  Job:  [id number]

## 2013-06-03 NOTE — Progress Notes (Signed)
Physical Therapy Treatment Patient Details Name: Beth Mcdowell MRN: [id number] DOB: [birth year] Today's Date: 06/03/2013 Time: 4098-1191 PT Time Calculation (min): 42 min  PT Assessment / Plan / Recommendation  History of Present Illness THR   PT Comments   Improvement in activity tolerance noted and with no decline in BP with activity  Follow Up Recommendations  Home health PT     Does the patient have the potential to tolerate intense rehabilitation     Barriers to Discharge        Equipment Recommendations  None recommended by PT    Recommendations for Other Services OT consult  Frequency 7X/week   Progress towards PT Goals Progress towards PT goals: Progressing toward goals  Plan Current plan remains appropriate    Precautions / Restrictions Precautions Precautions: Fall Precaution Comments: Pt states she passes out very easily and to expect same Restrictions Weight Bearing Restrictions: No Other Position/Activity Restrictions: WBAT   Pertinent Vitals/Pain 6/10; premed, ice pack provided    Mobility  Bed Mobility Bed Mobility: Sit to Supine Supine to Sit: 4: Min assist Sit to Supine: 3: Mod assist Details for Bed Mobility Assistance: cues for sequence and use of L LE to self assist Transfers Transfers: Sit to Stand;Stand to Sit Sit to Stand: 4: Min assist;3: Mod assist;From chair/3-in-1;Without upper extremity assist Sit to Stand: Patient Percentage: 70% Stand to Sit: 4: Min assist;3: Mod assist;To bed;To chair/3-in-1;With upper extremity assist Stand to Sit: Patient Percentage: 70% Details for Transfer Assistance: cues for LE management and use of UEs to self assist Ambulation/Gait Ambulation/Gait Assistance: 4: Min assist;3: Mod assist Ambulation Distance (Feet): 47 Feet Assistive device: Rolling walker Ambulation/Gait Assistance Details: cues for posture, sequence, position from RW and stride length Gait Pattern: Step-to pattern;Decreased step length -  right;Decreased step length - left;Shuffle Stairs: No    Exercises     PT Diagnosis:    PT Problem List:   PT Treatment Interventions:     PT Goals (current goals can now be found in the care plan section) Acute Rehab PT Goals Patient Stated Goal: REsume previous lifestyle with decreased pain PT Goal Formulation: With patient Time For Goal Achievement: 06/08/13 Potential to Achieve Goals: Good  Visit Information  Last PT Received On: 06/03/13 Assistance Needed: +1 History of Present Illness: THR    Subjective Data  Subjective: They changed my meds and I think the new muscle relax is helping a lot Patient Stated Goal: REsume previous lifestyle with decreased pain   Cognition  Cognition Arousal/Alertness: Awake/alert Behavior During Therapy: WFL for tasks assessed/performed Overall Cognitive Status: Within Functional Limits for tasks assessed    Balance     End of Session PT - End of Session Equipment Utilized During Treatment: Gait belt Activity Tolerance: Patient limited by pain Patient left: in bed;with call bell/phone within reach Nurse Communication: Mobility status   GP     Beth Mcdowell 06/03/2013, 1:53 PM

## 2013-06-03 NOTE — Evaluation (Signed)
Occupational Therapy Evaluation Patient Details Name: Beth Mcdowell MRN: [id number] DOB: 03-May-1951 Today's Date: 06/03/2013 Time: 1030-1100 OT Time Calculation (min): 30 min  OT Assessment / Plan / Recommendation History of present illness THR   Clinical Impression   Pt presents to OT with decreased I with ADL activity s/p THA. Pt will benefit from skilled OT to increase I with ADL activity and return to PLOF    OT Assessment  Patient needs continued OT Services    Follow Up Recommendations  Home health OT             Frequency  Min 2X/week    Precautions / Restrictions Precautions Precautions: Fall Precaution Comments: Pt states she passes out very easily and to expect same Restrictions Weight Bearing Restrictions: No Other Position/Activity Restrictions: WBAT       ADL  Transfers/Ambulation Related to ADLs: Pt feeling dizzy and nauseous- but was complaining of muscle spasms in bottom and back of leg. Pt did reposition in chair with A of OT into more upright position in which pt did feel was more comfortable.  Educated pt on positioning and ADL activity s/p THA.  Pt will borrow 3 n 1 and possibly tub seat from  friend    OT Diagnosis: Generalized weakness;Acute pain  OT Problem List: Decreased strength;Decreased activity tolerance;Pain OT Treatment Interventions: Self-care/ADL training;Patient/family education;DME and/or AE instruction;Therapeutic activities   OT Goals(Current goals can be found in the care plan section) Acute Rehab OT Goals Patient Stated Goal: REsume previous lifestyle with decreased pain OT Goal Formulation: With patient  Visit Information  Last OT Received On: 06/03/13 Assistance Needed: +1 Reason Eval/Treat Not Completed: Other (comment) (dizziness limiting patient) History of Present Illness: THR       Prior Functioning     Home Living Family/patient expects to be discharged to:: Private residence Living Arrangements: Spouse/significant  other Available Help at Discharge: Family Type of Home: House Home Access: Stairs to enter Entrance Stairs-Rails: None Home Layout: Two level Alternate Level Stairs-Number of Steps: 16 Alternate Level Stairs-Rails: Right Home Equipment: Walker - 2 wheels;Bedside commode Prior Function Level of Independence: Independent Communication Communication: No difficulties         Vision/Perception Vision - History Patient Visual Report: No change from baseline   Cognition  Cognition Arousal/Alertness: Awake/alert Behavior During Therapy: WFL for tasks assessed/performed Overall Cognitive Status: Within Functional Limits for tasks assessed       Mobility Bed Mobility Bed Mobility: Sit to Supine Supine to Sit: 4: Min assist Sit to Supine: 3: Mod assist Details for Bed Mobility Assistance: cues for sequence and use of L LE to self assist Transfers Sit to Stand: 4: Min assist;3: Mod assist;From chair/3-in-1;Without upper extremity assist Sit to Stand: Patient Percentage: 70% Stand to Sit: 4: Min assist;3: Mod assist;To bed;To chair/3-in-1;With upper extremity assist Stand to Sit: Patient Percentage: 70% Details for Transfer Assistance: cues for LE management and use of UEs to self assist           End of Session OT - End of Session Activity Tolerance: Patient tolerated treatment well Patient left: in chair Nurse Communication: Mobility status  GO     Alba Cory 06/03/2013, 1:54 PM

## 2013-06-03 NOTE — Progress Notes (Signed)
Physical Therapy Treatment Patient Details Name: Beth Mcdowell MRN: [id number] DOB: 1950/09/29 Today's Date: 06/03/2013 Time: 4098-1191 PT Time Calculation (min): 26 min  PT Assessment / Plan / Recommendation  History of Present Illness THR   PT Comments   Pt limited this date by c/o spasms in back, buttock and thigh as well as nausea and dizziness with OOB activity  Follow Up Recommendations  Home health PT     Does the patient have the potential to tolerate intense rehabilitation     Barriers to Discharge        Equipment Recommendations  None recommended by PT    Recommendations for Other Services OT consult  Frequency 7X/week   Progress towards PT Goals Progress towards PT goals: Progressing toward goals  Plan Current plan remains appropriate    Precautions / Restrictions Precautions Precautions: Fall Precaution Comments: Pt states she passes out very easily and to expect same Restrictions Weight Bearing Restrictions: No Other Position/Activity Restrictions: WBAT   Pertinent Vitals/Pain 8/10; premed, ice packs provided.  BP 88/52 - RN aware    Mobility  Bed Mobility Bed Mobility: Supine to Sit;Sit to Supine Supine to Sit: 4: Min assist;3: Mod assist Sit to Supine: 3: Mod assist Details for Bed Mobility Assistance: cues for sequence and use of L LE to self assist Transfers Transfers: Sit to Stand;Stand to Sit Sit to Stand: 1: +2 Total assist Sit to Stand: Patient Percentage: 70% Stand to Sit: 1: +2 Total assist Stand to Sit: Patient Percentage: 70% Details for Transfer Assistance: cues for LE management and use of UEs to self assist Ambulation/Gait Ambulation/Gait Assistance: 1: +2 Total assist Ambulation/Gait: Patient Percentage: 80% Ambulation Distance (Feet): 15 Feet Assistive device: Rolling walker Ambulation/Gait Assistance Details: cues for sequence, posture, position from RW, and stride length Gait Pattern: Step-to pattern;Decreased step length -  right;Decreased step length - left;Shuffle General Gait Details: Ltd by c/o nausea and dizziness  Stairs: No    Exercises     PT Diagnosis:    PT Problem List:   PT Treatment Interventions:     PT Goals (current goals can now be found in the care plan section) Acute Rehab PT Goals Patient Stated Goal: REsume previous lifestyle with decreased pain PT Goal Formulation: With patient Time For Goal Achievement: 06/08/13 Potential to Achieve Goals: Good  Visit Information  Last PT Received On: 06/03/13 Assistance Needed: +2 (Pt is orthostatic) History of Present Illness: THR    Subjective Data  Subjective: I'm having a lot of back spasms, I think I just need to get up and move Patient Stated Goal: REsume previous lifestyle with decreased pain   Cognition  Cognition Arousal/Alertness: Awake/alert Behavior During Therapy: WFL for tasks assessed/performed Overall Cognitive Status: Within Functional Limits for tasks assessed    Balance     End of Session PT - End of Session Equipment Utilized During Treatment: Gait belt Activity Tolerance: Patient limited by pain;Other (comment) (orthostatic, nausea) Patient left: in chair;with call bell/phone within reach;with family/visitor present Nurse Communication: Mobility status   GP     Beth Mcdowell 06/03/2013, 12:02 PM

## 2013-06-03 NOTE — Progress Notes (Signed)
Physical Therapy Treatment Patient Details Name: EVEE LISKA MRN: [id number] DOB: [birth year] Today's Date: 06/03/2013 Time: 1534-1610 PT Time Calculation (min): 36 min  PT Assessment / Plan / Recommendation  History of Present Illness THR   PT Comments     Follow Up Recommendations  Home health PT     Does the patient have the potential to tolerate intense rehabilitation     Barriers to Discharge        Equipment Recommendations  None recommended by PT    Recommendations for Other Services OT consult  Frequency 7X/week   Progress towards PT Goals Progress towards PT goals: Progressing toward goals  Plan Current plan remains appropriate    Precautions / Restrictions Precautions Precautions: Fall Precaution Comments: Pt states she passes out very easily and to expect same Restrictions Weight Bearing Restrictions: No Other Position/Activity Restrictions: WBAT   Pertinent Vitals/Pain 5/10; ice packs provided    Mobility  Bed Mobility Bed Mobility: Supine to Sit;Sit to Supine Supine to Sit: 4: Min assist Sit to Supine: 4: Min assist Details for Bed Mobility Assistance: cues for sequence and use of L LE to self assist Transfers Transfers: Sit to Stand;Stand to Sit Sit to Stand: 4: Min assist;From bed;From chair/3-in-1 Stand to Sit: 4: Min assist;To bed;To chair/3-in-1 Details for Transfer Assistance: cues for LE management and use of UEs to self assist Ambulation/Gait Ambulation/Gait Assistance: 4: Min assist Ambulation Distance (Feet): 18 Feet (twice) Assistive device: Rolling walker Ambulation/Gait Assistance Details: cues for posture, sequence and position from RW Gait Pattern: Step-to pattern;Decreased step length - right;Decreased step length - left;Shuffle Stairs: No    Exercises Total Joint Exercises Ankle Circles/Pumps: AROM;15 reps;Supine;Both Quad Sets: AROM;10 reps;Supine;Both Gluteal Sets: AROM;Both;10 reps;Supine Heel Slides: AAROM;20  reps;Supine;Right Hip ABduction/ADduction: AAROM;Right;Supine;20 reps   PT Diagnosis:    PT Problem List:   PT Treatment Interventions:     PT Goals (current goals can now be found in the care plan section) Acute Rehab PT Goals Patient Stated Goal: REsume previous lifestyle with decreased pain PT Goal Formulation: With patient Time For Goal Achievement: 06/08/13 Potential to Achieve Goals: Good  Visit Information  Last PT Received On: 06/03/13 Assistance Needed: +1 History of Present Illness: THR    Subjective Data  Subjective: They changed my meds and I think the new muscle relax is helping a lot Patient Stated Goal: REsume previous lifestyle with decreased pain   Cognition  Cognition Arousal/Alertness: Awake/alert Behavior During Therapy: WFL for tasks assessed/performed Overall Cognitive Status: Within Functional Limits for tasks assessed    Balance     End of Session PT - End of Session Equipment Utilized During Treatment: Gait belt Activity Tolerance: Patient tolerated treatment well Patient left: in bed;with call bell/phone within reach Nurse Communication: Mobility status   GP     Marleny Faller 06/03/2013, 4:15 PM

## 2013-06-04 LAB — CBC
HCT: 26.8 % — ABNORMAL LOW (ref 36.0–46.0)
MCV: 89 fL (ref 78.0–100.0)
Platelets: 178 10*3/uL (ref 150–400)
RBC: 3.01 MIL/uL — ABNORMAL LOW (ref 3.87–5.11)
RDW: 12.6 % (ref 11.5–15.5)
WBC: 8.1 10*3/uL (ref 4.0–10.5)

## 2013-06-04 MED ORDER — CYCLOBENZAPRINE HCL 10 MG PO TABS: 10.0000 mg | ORAL_TABLET | Freq: Three times a day (TID) | ORAL | Status: DC | PRN

## 2013-06-04 NOTE — Progress Notes (Signed)
Physical Therapy Treatment Patient Details Name: Beth Mcdowell MRN: [id number] DOB: March 20, 1951 Today's Date: 06/04/2013 Time: 4132-4401 PT Time Calculation (min): 38 min  PT Assessment / Plan / Recommendation  History of Present Illness R THR   PT Comments   Pt progressing well and from PT perspective should be ready for d/c in am.  Follow Up Recommendations  Home health PT     Does the patient have the potential to tolerate intense rehabilitation     Barriers to Discharge        Equipment Recommendations  Rolling walker with 5" wheels    Recommendations for Other Services OT consult  Frequency 7X/week   Progress towards PT Goals Progress towards PT goals: Progressing toward goals  Plan Current plan remains appropriate    Precautions / Restrictions Precautions Precautions: Fall Restrictions Weight Bearing Restrictions: No Other Position/Activity Restrictions: WBAT   Pertinent Vitals/Pain 4/10; ice packs provided    Mobility  Bed Mobility Bed Mobility: Sit to Supine Sit to Supine: 4: Min guard Details for Bed Mobility Assistance: cues for sequence and use of L LE to self assist Transfers Transfers: Sit to Stand;Stand to Sit Sit to Stand: 4: Min guard;From chair/3-in-1 Stand to Sit: 4: Min guard;To bed;To chair/3-in-1 Details for Transfer Assistance: cues for LE management and use of UEs to self assist Ambulation/Gait Ambulation/Gait Assistance: 4: Min guard;5: Supervision Ambulation Distance (Feet): 222 Feet (and 123) Assistive device: Rolling walker Ambulation/Gait Assistance Details: min cues for posture and position from RW Gait Pattern: Step-to pattern;Step-through pattern;Shuffle General Gait Details: Progressed to recip gait Stairs: Yes Stairs Assistance: 4: Min assist Stairs Assistance Details (indicate cue type and reason): cues for sequence, foot/RW/crutch placement Stair Management Technique: No rails;One rail Right;Step to  pattern;Forwards;Backwards;With walker;With crutches Number of Stairs: 10 (4 twice with crutch/rail; 1 twice bkwd with RW; spouse assis)    Exercises     PT Diagnosis:    PT Problem List:   PT Treatment Interventions:     PT Goals (current goals can now be found in the care plan section) Acute Rehab PT Goals Patient Stated Goal: REsume previous lifestyle with decreased pain PT Goal Formulation: With patient Time For Goal Achievement: 06/08/13 Potential to Achieve Goals: Good  Visit Information  Last PT Received On: 06/04/13 Assistance Needed: +1 History of Present Illness: R THR    Subjective Data  Subjective: I am doing so much better than yesterday Patient Stated Goal: REsume previous lifestyle with decreased pain   Cognition  Cognition Arousal/Alertness: Awake/alert Behavior During Therapy: WFL for tasks assessed/performed Overall Cognitive Status: Within Functional Limits for tasks assessed    Balance     End of Session PT - End of Session Equipment Utilized During Treatment: Gait belt Activity Tolerance: Patient tolerated treatment well Patient left: in bed;with call bell/phone within reach Nurse Communication: Mobility status   GP     Tiarrah Saville 06/04/2013, 2:56 PM

## 2013-06-04 NOTE — Progress Notes (Signed)
   CARE MANAGEMENT NOTE 06/04/2013  Patient:  Beth Mcdowell, Beth Mcdowell   Account Number:  [id number]  Date Initiated:  06/03/2013  Documentation initiated by:  [facility]  Subjective/Objective Assessment:   RIGHT TOTAL HIP ARTHROPLASTY ANTERIOR APPROACH     Action/Plan:   HH   Anticipated DC Date:  06/05/2013   Anticipated DC Plan:  HOME W HOME HEALTH SERVICES      DC Planning Services  CM consult      [facility] Choice  HOME HEALTH   Choice offered to / List presented to:  C-1 Patient   DME arranged  3-N-1  Levan Hurst      DME agency  [facility]     HH arranged  HH-2 PT      [facility] agency  [facility]   Status of service:  Completed, signed off Medicare Important Message given?   (If response is "NO", the following Medicare IM given date fields will be blank) Date Medicare IM given:   Date Additional Medicare IM given:    Discharge Disposition:  HOME W HOME HEALTH SERVICES  Per UR Regulation:    If discussed at Long Length of Stay Meetings, dates discussed:    Comments:  06/04/2013 1720 Notified [employer] of dc home today. [facility] will ship DME to her home. Pt states she can borrow a RW from friend to use until she receives her DME. Husband at home to assist. Isidoro Donning RN CCM Case Mgmt phone [phone]

## 2013-06-04 NOTE — Progress Notes (Signed)
Physical Therapy Treatment Patient Details Name: Beth Mcdowell MRN: [id number] DOB: 12/09/1950 Today's Date: 06/04/2013 Time: 1012-1042 PT Time Calculation (min): 30 min  PT Assessment / Plan / Recommendation  History of Present Illness THR   PT Comments   Marked improvement in activity tolerance with decreased pain and no c/o dizziness  Follow Up Recommendations  Home health PT     Does the patient have the potential to tolerate intense rehabilitation     Barriers to Discharge        Equipment Recommendations  Rolling walker with 5" wheels    Recommendations for Other Services OT consult  Frequency 7X/week   Progress towards PT Goals Progress towards PT goals: Progressing toward goals  Plan Current plan remains appropriate    Precautions / Restrictions Precautions Precautions: Fall Precaution Comments: Pt states she passes out very easily and to expect same Restrictions Weight Bearing Restrictions: No Other Position/Activity Restrictions: WBAT   Pertinent Vitals/Pain 5/10; premed, ice pack provided    Mobility  Bed Mobility Bed Mobility: Sit to Supine Sit to Supine: 4: Min guard Details for Bed Mobility Assistance: cues for sequence and use of L LE to self assist Transfers Transfers: Sit to Stand;Stand to Sit Sit to Stand: 4: Min guard;From chair/3-in-1 Stand to Sit: 4: Min guard;To bed Details for Transfer Assistance: cues for LE management and use of UEs to self assist Ambulation/Gait Ambulation/Gait Assistance: 4: Min guard Ambulation Distance (Feet): 200 Feet Assistive device: Rolling walker Ambulation/Gait Assistance Details: min cues for posture and position from RW Gait Pattern: Step-to pattern;Decreased step length - right;Decreased step length - left;Shuffle Stairs: No    Exercises Total Joint Exercises Ankle Circles/Pumps: AROM;15 reps;Supine;Both Quad Sets: AROM;10 reps;Supine;Both Gluteal Sets: AROM;Both;10 reps;Supine Heel Slides: AAROM;20  reps;Supine;Right Hip ABduction/ADduction: AAROM;Right;Supine;20 reps   PT Diagnosis:    PT Problem List:   PT Treatment Interventions:     PT Goals (current goals can now be found in the care plan section) Acute Rehab PT Goals Patient Stated Goal: REsume previous lifestyle with decreased pain PT Goal Formulation: With patient Time For Goal Achievement: 06/08/13 Potential to Achieve Goals: Good  Visit Information  Last PT Received On: 06/04/13 Assistance Needed: +1 History of Present Illness: THR    Subjective Data  Subjective: I am doing so much better than yesterday Patient Stated Goal: REsume previous lifestyle with decreased pain   Cognition  Cognition Arousal/Alertness: Awake/alert Behavior During Therapy: WFL for tasks assessed/performed Overall Cognitive Status: Within Functional Limits for tasks assessed    Balance     End of Session PT - End of Session Equipment Utilized During Treatment: Gait belt Activity Tolerance: Patient tolerated treatment well Patient left: in bed;with call bell/phone within reach Nurse Communication: Mobility status   GP     Beth Mcdowell 06/04/2013, 10:56 AM

## 2013-06-04 NOTE — Progress Notes (Signed)
Patient ID: Beth Mcdowell, female   DOB: 01/19/1951, 62 y.o.   MRN: [id number] Hemoglobin 8.8. Patient had significant amount of muscle spasm yesterday but she states she's feeling better today. Plan for discharge to home on Monday. Patient lost about a day of rehabilitation do to her muscle spasms most likely do to her spinal stenosis.

## 2013-06-04 NOTE — Progress Notes (Signed)
Discharged from floor via w/c, spouse with pt. No changes in assessment. Beth Mcdowell   

## 2013-06-04 NOTE — Progress Notes (Signed)
Occupational Therapy Treatment Patient Details Name: Beth Mcdowell MRN: [id number] DOB: [birth year] Today's Date: 06/04/2013 Time: 6213-0865 OT Time Calculation (min): 11 min  OT Assessment / Plan / Recommendation  History of present illness THR   OT comments  Pt much better today!  Follow Up Recommendations  Home health OT       Equipment Recommendations  3 in 1 bedside comode             Plan Discharge plan remains appropriate    Precautions / Restrictions Precautions Precautions: Fall Precaution Comments: Pt states she passes out very easily and to expect same Restrictions Weight Bearing Restrictions: No Other Position/Activity Restrictions: WBAT       ADL  Lower Body Dressing: Moderate assistance Where Assessed - Lower Body Dressing: Unsupported sit to stand Toilet Transfer: Min Pension scheme manager Method: Sit to stand;Stand pivot Equipment Used: Rolling walker;Reacher;Sock aid Transfers/Ambulation Related to ADLs: Educated pt on use of AE and dressing s/ THA. Pt did decide she wants her own 3 n 1 to put upstairs and borrowed one downstairs.      OT Goals(current goals can now be found in the care plan section) Acute Rehab OT Goals Patient Stated Goal: REsume previous lifestyle with decreased pain  Visit Information  Assistance Needed: +1 History of Present Illness: THR          Cognition  Cognition Arousal/Alertness: Awake/alert Behavior During Therapy: WFL for tasks assessed/performed Overall Cognitive Status: Within Functional Limits for tasks assessed    Mobility  Bed Mobility Bed Mobility: Sit to Supine Sit to Supine: 4: Min guard Details for Bed Mobility Assistance: cues for sequence and use of L LE to self assist Transfers Sit to Stand: 4: Min guard;From chair/3-in-1 Stand to Sit: 4: Min guard;To bed Details for Transfer Assistance: cues for LE management and use of UEs to self assist    Exercises  Total Joint Exercises Ankle  Circles/Pumps: AROM;15 reps;Supine;Both Quad Sets: AROM;10 reps;Supine;Both Gluteal Sets: AROM;Both;10 reps;Supine Heel Slides: AAROM;20 reps;Supine;Right Hip ABduction/ADduction: AAROM;Right;Supine;20 reps      End of Session OT - End of Session Activity Tolerance: Patient tolerated treatment well Patient left: in bed Nurse Communication: Mobility status  GO     Alba Cory 06/04/2013, 11:44 AM

## 2013-06-05 NOTE — Discharge Summary (Signed)
  Final diagnosis osteoarthritis right hip. Surgical procedure right total hip arthroplasty. Discharge to home in stable condition. Followup in the office in 2 weeks.

## 2014-01-07 ENCOUNTER — Ambulatory Visit (INDEPENDENT_AMBULATORY_CARE_PROVIDER_SITE_OTHER): Payer: BC Managed Care – PPO | Admitting: Emergency Medicine

## 2014-01-07 VITALS — BP 90/50 | HR 63 | Temp 97.4°F | Resp 20 | Ht 68.0 in | Wt 147.0 lb

## 2014-01-07 DIAGNOSIS — A088 Other specified intestinal infections: Secondary | ICD-10-CM

## 2014-01-07 MED ORDER — LOPERAMIDE HCL 2 MG PO TABS: ORAL_TABLET | ORAL | Status: AC

## 2014-01-07 MED ORDER — ONDANSETRON HCL 8 MG PO TABS: 8.0000 mg | ORAL_TABLET | Freq: Three times a day (TID) | ORAL | Status: AC | PRN

## 2014-01-07 NOTE — Patient Instructions (Signed)
Clear Liquid Diet A clear liquid diet is a short-term diet that is prescribed to provide the necessary fluid and basic energy you need when you can have nothing else. The clear liquid diet consists of liquids or solids that will become liquid at room temperature. You should be able to see through the liquid. There are many reasons that you may be restricted to clear liquids, such as:  When you have a sudden-onset (acute) condition that occurs before or after surgery.  To help your body slowly get adjusted to food again after a long period when you were unable to have food.  Replacement of fluids when you have a diarrheal disease.  When you are going to have certain exams, such as a colonoscopy, in which instruments are inserted inside your body to look at parts of your digestive system. WHAT CAN I HAVE? A clear liquid diet does not provide all the nutrients you need. It is important to choose a variety of the following items to get as many nutrients as possible:  Vegetable juices that do not have pulp.  Fruit juices and fruit drinks that do not have pulp.  Coffee (regular or decaffeinated), tea, or soda at the discretion of your health care provider.  Clear bouillon, broth, or strained broth-based soups.  High-protein and flavored gelatins.  Sugar or honey.  Ices or frozen ice pops that do not contain milk. If you are not sure whether you can have certain items, you should ask your health care provider. You may also ask your health care provider if there are any other clear liquid options. Document Released: 06/15/2005 Document Revised: 06/20/2013 Document Reviewed: 05/12/2013 ExitCare Patient Information 2015 [employer]. This information is not intended to replace advice given to you by your health care provider. Make sure you discuss any questions you have with your health care provider. Viral Gastroenteritis Viral gastroenteritis is also known as stomach flu. This condition  affects the stomach and intestinal tract. It can cause sudden diarrhea and vomiting. The illness typically lasts 3 to 8 days. Most people develop an immune response that eventually gets rid of the virus. While this natural response develops, the virus can make you quite ill. CAUSES  Many different viruses can cause gastroenteritis, such as rotavirus or noroviruses. You can catch one of these viruses by consuming contaminated food or water. You may also catch a virus by sharing utensils or other personal items with an infected person or by touching a contaminated surface. SYMPTOMS  The most common symptoms are diarrhea and vomiting. These problems can cause a severe loss of body fluids (dehydration) and a body salt (electrolyte) imbalance. Other symptoms may include:  Fever.  Headache.  Fatigue.  Abdominal pain. DIAGNOSIS  Your caregiver can usually diagnose viral gastroenteritis based on your symptoms and a physical exam. A stool sample may also be taken to test for the presence of viruses or other infections. TREATMENT  This illness typically goes away on its own. Treatments are aimed at rehydration. The most serious cases of viral gastroenteritis involve vomiting so severely that you are not able to keep fluids down. In these cases, fluids must be given through an intravenous line (IV). HOME CARE INSTRUCTIONS   Drink enough fluids to keep your urine clear or pale yellow. Drink small amounts of fluids frequently and increase the amounts as tolerated.  Ask your caregiver for specific rehydration instructions.  Avoid:  Foods high in sugar.  Alcohol.  Carbonated drinks.  Tobacco.  Juice.    Caffeine drinks.  Extremely hot or cold fluids.  Fatty, greasy foods.  Too much intake of anything at one time.  Dairy products until 24 to 48 hours after diarrhea stops.  You may consume probiotics. Probiotics are active cultures of beneficial bacteria. They may lessen the amount and  number of diarrheal stools in adults. Probiotics can be found in yogurt with active cultures and in supplements.  Wash your hands well to avoid spreading the virus.  Only take over-the-counter or prescription medicines for pain, discomfort, or fever as directed by your caregiver. Do not give aspirin to children. Antidiarrheal medicines are not recommended.  Ask your caregiver if you should continue to take your regular prescribed and over-the-counter medicines.  Keep all follow-up appointments as directed by your caregiver. SEEK IMMEDIATE MEDICAL CARE IF:   You are unable to keep fluids down.  You do not urinate at least once every 6 to 8 hours.  You develop shortness of breath.  You notice blood in your stool or vomit. This may look like coffee grounds.  You have abdominal pain that increases or is concentrated in one small area (localized).  You have persistent vomiting or diarrhea.  You have a fever.  The patient is a child younger than 3 months, and he or she has a fever.  The patient is a child older than 3 months, and he or she has a fever and persistent symptoms.  The patient is a child older than 3 months, and he or she has a fever and symptoms suddenly get worse.  The patient is a baby, and he or she has no tears when crying. MAKE SURE YOU:   Understand these instructions.  Will watch your condition.  Will get help right away if you are not doing well or get worse. Document Released: 06/15/2005 Document Revised: 09/07/2011 Document Reviewed: 04/01/2011 ExitCare Patient Information 2015 [employer]. This information is not intended to replace advice given to you by your health care provider. Make sure you discuss any questions you have with your health care provider.  

## 2014-01-07 NOTE — Progress Notes (Signed)
[facility] [address] [place] [address] [phone]  Date:  01/07/2014   Name:  Beth Mcdowell   DOB:  Jan 12, 1951   MRN:  [id number]  PCP:  Martha Clan, MD    Chief Complaint: Abdominal Pain   History of Present Illness:  Beth Mcdowell is a 63 y.o. very pleasant female patient who presents with the following:  Patient has been having diarrhea that is watery in consistency all night long.  No vomiting but nauseated.  Unable to take any liquids.  The patient has no complaint of blood, mucous, or pus in her stools. No fever or chills.  Diffuse abdominal cramps.  Thirsty.  .No sketchy water or travel.  No improvement with over the counter medications or other home remedies. Denies other complaint or health concern today.   Patient Active Problem List   Diagnosis Date Noted  . Degenerative arthritis of right hip 06/02/2013  . Status post THR (total hip replacement) 06/02/2013    Past Medical History  Diagnosis Date  . Thyroid disease 1980    nodule- treated w/ synthroid for a couple of years  . Arthritis     hips  . Spinal stenosis     Hx: of  . Muscle spasm     SPASMS BOTH LEGS AND FEET -FROM LUMBAR STENOSIS  . Anemia     SOMETIMES    Past Surgical History  Procedure Laterality Date  . X-stop implantation  02/2012  . Ovarian cyst removal  1977, 1980  . Back surgery      Hx: of lumbar  . Dilation and curettage of uterus    . Colonoscopy      Hx: of  . Breast biopsy      Hx: of left breast 2/ 2014  . Breast biopsy Left 02/20/2013    Procedure: Left breast wire guided excision;  Surgeon: Emelia Loron, MD;  Location: [facility] OR;  Service: General;  Laterality: Left;  . Total hip arthroplasty Right 06/02/2013    Procedure: RIGHT TOTAL HIP ARTHROPLASTY ANTERIOR APPROACH;  Surgeon: Kathryne Hitch, MD;  Location: [facility] ORS;  Service: Orthopedics;  Laterality: Right;    History  Substance Use Topics  . Smoking status: Never Smoker   .  Smokeless tobacco: Never Used  . Alcohol Use: Yes     Comment: 3 glasses/week    Family History  Problem Relation Age of Onset  . Heart disease Mother   . Heart disease Father   . Cancer Brother   . Hypertension Other     Allergies  Allergen Reactions  . Erythromycin     GI upset... Pt stays away from all mycins    Medication list has been reviewed and updated.  Current Outpatient Prescriptions on File Prior to Visit  Medication Sig Dispense Refill  . Ibuprofen-Diphenhydramine Cit (ADVIL PM PO) Take 1 tablet by mouth every 6 (six) hours as needed (sleep).        No current facility-administered medications on file prior to visit.    Review of Systems:  As per HPI, otherwise negative.    Physical Examination: Filed Vitals:   01/07/14 1100  BP: 90/50  Pulse: 63  Temp: 97.4 F (36.3 C)  Resp: 20   Filed Vitals:   01/07/14 1100  Height: 5\' 8"  (1.727 m)  Weight: 147 lb (66.679 kg)   Body mass index is 22.36 kg/(m^2). Ideal Body Weight: Weight in (lb) to have BMI = 25: 164.1  GEN: WDWN, ill appearing, Non-toxic, A & O x 3  Dry HEENT: Atraumatic, Normocephalic. Neck supple. No masses, No LAD. Ears and Nose: No external deformity. CV: RRR, No M/G/R. No JVD. No thrill. No extra heart sounds. PULM: CTA B, no wheezes, crackles, rhonchi. No retractions. No resp. distress. No accessory muscle use. ABD: S, NT, ND, +BS. No rebound. No HSM. EXTR: No c/c/e NEURO Normal gait.  PSYCH: Normally interactive. Conversant. Not depressed or anxious appearing.  Calm demeanor.     Assessment and Plan: Nausea and vomiting Diarrhea IV 1 liter zofran Imodium  Signed,  Phillips OdorJeffery Anderson, MD

## 2014-07-09 ENCOUNTER — Other Ambulatory Visit: Payer: Self-pay | Admitting: Dermatology

## 2014-12-24 ENCOUNTER — Encounter: Payer: Self-pay | Admitting: *Deleted

## 2015-02-20 IMAGING — CR DG HIP 1V PORT*R*
1 series · 1 of 1 positions shown · non-contrast
Comparison: Intra op view dated 06/02/2013

CLINICAL DATA: Status post right hip arthroplasty

EXAM:
PORTABLE RIGHT HIP - 1 VIEW

[shoot- thru lateral]
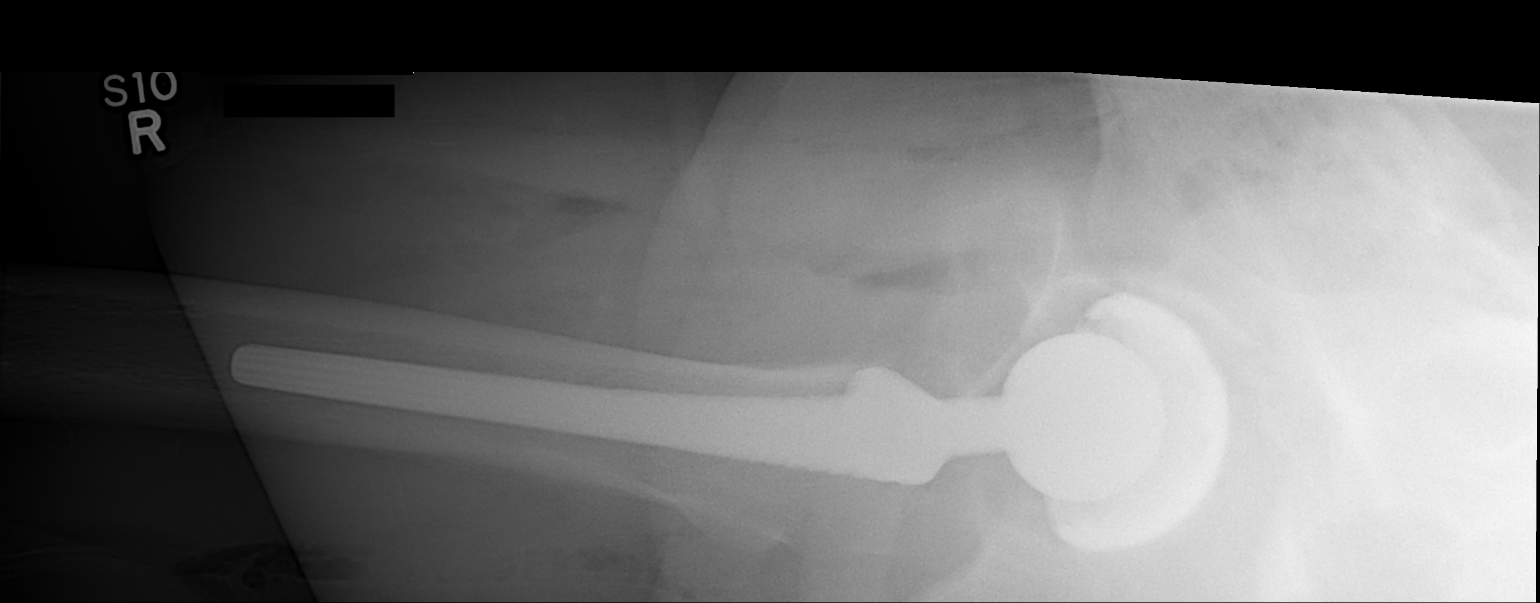

[1 of 1 positions shown; findings below may reference images not displayed]

FINDINGS: A right hip arthroplasty is appreciated. The single cross-table
lateral view demonstrates minimal osseous neural hardware
abnormalities. Postsurgical changes identified within the soft
tissues.
IMPRESSION: Patient is status post right hip arthroplasty.

## 2016-12-15 ENCOUNTER — Ambulatory Visit (INDEPENDENT_AMBULATORY_CARE_PROVIDER_SITE_OTHER): Payer: Medicare Other | Admitting: Orthopaedic Surgery

## 2016-12-15 ENCOUNTER — Encounter (INDEPENDENT_AMBULATORY_CARE_PROVIDER_SITE_OTHER): Payer: Self-pay | Admitting: Orthopaedic Surgery

## 2016-12-15 ENCOUNTER — Ambulatory Visit (INDEPENDENT_AMBULATORY_CARE_PROVIDER_SITE_OTHER): Payer: Medicare Other

## 2016-12-15 VITALS — Ht 68.0 in | Wt 147.0 lb

## 2016-12-15 DIAGNOSIS — M25551 Pain in right hip: Secondary | ICD-10-CM | POA: Diagnosis not present

## 2016-12-15 DIAGNOSIS — M25552 Pain in left hip: Secondary | ICD-10-CM | POA: Diagnosis not present

## 2016-12-15 NOTE — Progress Notes (Signed)
Office Visit Note   Patient: Beth Mcdowell           Date of Birth: 02-10-1951           MRN: [id number] Visit Date: 12/15/2016              Requested by: Martha Clan, MD [address] [place], [place] [address] PCP: Martha Clan, MD   Assessment & Plan: Visit Diagnoses:  1. Hip pain, bilateral     Plan: She is been having some occasional deep pain in her right side. This may be related to loosening of femoral component. I will have her work on activity modification and anti-inflammatories as well as occasionally back off on her high impact aerobic activities. I would like to see her back in 6 months with a repeat AP pelvis but I would like that taken with her supine. We'll have her take anti-inflammatories as well. My next step would be a 3 phase bone scan if she continues to have pain orbits worsening in any way.  Follow-Up Instructions: Return in about 6 months (around 06/16/2017).   Orders:  Orders Placed This Encounter  Procedures  . XR Pelvis 1-2 Views   No orders of the defined types were placed in this encounter.     Procedures: No procedures performed   Clinical Data: No additional findings.   Subjective: Chief Complaint  Patient presents with  . Right Hip - Pain    Anterior total hip 2014  . Left Hip - Pain  The patient is very pleasant 66 year old female who is 4 years out from a right direct anterior hip replacement. She's been having some occasional pain in that hip she's had some left hip pain with extremes of activities. She says is not bad enough to have any surgery at and is worse with carrying things and with high impact aerobic activities. We will see her today to check both of her hips. She also reports low back pain and points the facet joint areas in the paraspinal muscles of her low back. It does not radiate neither foot. She does see a massage therapist on regular basis and goes to gym almost daily. She is a petite individuals  well.  HPI  Review of Systems She currently denies any headache, chest pain, shortness of breath, fever, chills, nausea, vomiting.  Objective: Vital Signs: Ht 5\' 8"  (1.727 m)   Wt 147 lb (66.7 kg)   BMI 22.35 kg/m   Physical Exam She is alert and oriented 3 and in no acute distress Ortho Exam She has excellent flexion-extension of the lumbar spine was some tightness in hamstrings. She has pain around the facet joint to the paraspinal muscles but there is no radicular symptoms and there is no positive straight leg raise. Both hips have fluid internal/external rotation with no discomfort today. Her leg lengths are equal. Specialty Comments:  No specialty comments available.  Imaging: Xr Pelvis 1-2 Views  Result Date: 12/15/2016 The pelvis shows a right total hip arthroplasty and mild arthritic changes of the left hip. The right hip femoral component shows a bony bridge at the tip of the stem slight lucency at the proximal shoulder part. I compared these with previous films it does look like she is loss some of her bone cortex over the last 4 years.    PMFS History: Patient Active Problem List   Diagnosis Date Noted  . Degenerative arthritis of right hip 06/02/2013  . Status post THR (total hip  replacement) 06/02/2013   Past Medical History:  Diagnosis Date  . Anemia    SOMETIMES  . Arthritis    hips  . Muscle spasm    SPASMS BOTH LEGS AND FEET -FROM LUMBAR STENOSIS  . Spinal stenosis    Hx: of  . Thyroid disease 1980   nodule- treated w/ synthroid for a couple of years    Family History  Problem Relation Age of Onset  . Heart disease Mother   . Heart disease Father   . Cancer Brother   . Hypertension Other     Past Surgical History:  Procedure Laterality Date  . BACK SURGERY     Hx: of lumbar  . BREAST BIOPSY     Hx: of left breast 2/ 2014  . BREAST BIOPSY Left 02/20/2013   Procedure: Left breast wire guided excision;  Surgeon: Emelia LoronMatthew Wakefield, MD;   [facility];  Service: General;  Laterality: Left;  . COLONOSCOPY     Hx: of  . DILATION AND CURETTAGE OF UTERUS    . OVARIAN CYST REMOVAL  1977, 1980  . TOTAL HIP ARTHROPLASTY Right 06/02/2013   Procedure: RIGHT TOTAL HIP ARTHROPLASTY ANTERIOR APPROACH;  Surgeon: Kathryne Hitchhristopher Y Shamona Wirtz, MD;  Location: [facility] ORS;  Service: Orthopedics;  Laterality: Right;  . X-STOP IMPLANTATION  02/2012   Social History   Occupational History  . Not on file.   Social History Main Topics  . Smoking status: Never Smoker  . Smokeless tobacco: Never Used  . Alcohol use Yes     Comment: 3 glasses/week  . Drug use: No  . Sexual activity: Not on file

## 2017-06-16 ENCOUNTER — Ambulatory Visit (INDEPENDENT_AMBULATORY_CARE_PROVIDER_SITE_OTHER): Payer: Medicare Other | Admitting: Orthopaedic Surgery

## 2017-06-16 ENCOUNTER — Ambulatory Visit (INDEPENDENT_AMBULATORY_CARE_PROVIDER_SITE_OTHER): Payer: Medicare Other

## 2017-06-16 ENCOUNTER — Encounter (INDEPENDENT_AMBULATORY_CARE_PROVIDER_SITE_OTHER): Payer: Self-pay | Admitting: Orthopaedic Surgery

## 2017-06-16 DIAGNOSIS — Z96641 Presence of right artificial hip joint: Secondary | ICD-10-CM

## 2017-06-16 NOTE — Progress Notes (Signed)
The patient is here today for years status post right total hip arthroplasty.  We had seen her earlier in the year with thigh pain and I want see her back today to make sure everything is doing well and to get new x-rays.  She says she is pain-free now and only has a little bit of trouble if he is carrying a heavy grandchild but otherwise has no complaints at all.  She is denying left hip pain.  Imaging of both hips show fluid range of motion with no pain at all.  There is no deep thigh pain on either side she is walking without a limp either.  An AP pelvis is obtained and shows mild to moderate arthritis on the left hip.  The right total hip arthroplasty appears intact.  There is a sclerotic lucency next to the shoulder of the femoral component with there is no gross evidence of loosening on plain films and clinically she appears normal.  At this point she will follow-up as needed however she understands that knee pain worsens in any way she would need to see us back and I would obtain a three-phase bone scan if it was her right hip there is bothering her.

## 2018-08-16 ENCOUNTER — Other Ambulatory Visit (HOSPITAL_COMMUNITY): Payer: Self-pay | Admitting: Internal Medicine

## 2018-08-16 DIAGNOSIS — R42 Dizziness and giddiness: Secondary | ICD-10-CM

## 2018-08-17 ENCOUNTER — Ambulatory Visit (HOSPITAL_COMMUNITY)
Admission: RE | Admit: 2018-08-17 | Discharge: 2018-08-17 | Disposition: A | Discharge status: Home / Self Care | Payer: Medicare Other | Source: Ambulatory Visit | Attending: Vascular Surgery | Admitting: Vascular Surgery

## 2018-08-17 DIAGNOSIS — R42 Dizziness and giddiness: Secondary | ICD-10-CM | POA: Insufficient documentation

## 2019-07-20 ENCOUNTER — Ambulatory Visit: Payer: Medicare PPO | Attending: Internal Medicine

## 2019-07-20 DIAGNOSIS — Z23 Encounter for immunization: Secondary | ICD-10-CM | POA: Insufficient documentation

## 2019-07-20 NOTE — Progress Notes (Signed)
   Covid-19 Vaccination Clinic  Name:  Beth Mcdowell    MRN: [id number] DOB: 12/30/1950  07/20/2019  [name] was observed post Covid-19 immunization for 15 minutes without incidence. She was provided with Vaccine Information Sheet and instruction to access the V-Safe system.   [name] was instructed to call 911 with any severe reactions post vaccine: Marland Kitchen Difficulty breathing  . Swelling of your face and throat  . A fast heartbeat  . A bad rash all over your body  . Dizziness and weakness    Immunizations Administered    Name Date Dose VIS Date Route   Pfizer COVID-19 Vaccine 07/20/2019 10:28 AM 0.3 mL 06/09/2019 Intramuscular   Manufacturer: [employer], [employer]   Lot: [id number]   NDC: [address]-8

## 2019-08-09 ENCOUNTER — Ambulatory Visit: Payer: Medicare PPO | Attending: Internal Medicine

## 2019-08-09 DIAGNOSIS — Z23 Encounter for immunization: Secondary | ICD-10-CM | POA: Insufficient documentation

## 2019-08-09 NOTE — Progress Notes (Signed)
   Covid-19 Vaccination Clinic  Name:  KAYELYN LEMON    MRN: [id number] DOB: 04/24/1951  08/09/2019  Ms. Agard was observed post Covid-19 immunization for 15 minutes without incidence. She was provided with Vaccine Information Sheet and instruction to access the V-Safe system.   Ms. Reedy was instructed to call 911 with any severe reactions post vaccine: Marland Kitchen Difficulty breathing  . Swelling of your face and throat  . A fast heartbeat  . A bad rash all over your body  . Dizziness and weakness    Immunizations Administered    Name Date Dose VIS Date Route   Pfizer COVID-19 Vaccine 08/09/2019 10:09 AM 0.3 mL 06/09/2019 Intramuscular   Manufacturer: [employer], [employer]   Lot: [id number]   NDC: [address]-8

## 2019-08-10 ENCOUNTER — Ambulatory Visit: Payer: Medicare Other

## 2019-08-16 ENCOUNTER — Other Ambulatory Visit: Payer: Self-pay | Admitting: Internal Medicine

## 2019-08-16 DIAGNOSIS — I6521 Occlusion and stenosis of right carotid artery: Secondary | ICD-10-CM

## 2019-08-30 ENCOUNTER — Ambulatory Visit
Admission: RE | Admit: 2019-08-30 | Discharge: 2019-08-30 | Disposition: A | Discharge status: Home / Self Care | Payer: Medicare PPO | Source: Ambulatory Visit | Attending: Internal Medicine | Admitting: Internal Medicine

## 2019-08-30 DIAGNOSIS — I6521 Occlusion and stenosis of right carotid artery: Secondary | ICD-10-CM

## 2019-10-20 DIAGNOSIS — N1831 Chronic kidney disease, stage 3a: Secondary | ICD-10-CM | POA: Diagnosis not present

## 2019-10-20 DIAGNOSIS — I6521 Occlusion and stenosis of right carotid artery: Secondary | ICD-10-CM | POA: Diagnosis not present

## 2019-10-20 DIAGNOSIS — E041 Nontoxic single thyroid nodule: Secondary | ICD-10-CM | POA: Diagnosis not present

## 2019-10-20 DIAGNOSIS — D649 Anemia, unspecified: Secondary | ICD-10-CM | POA: Diagnosis not present

## 2019-10-20 DIAGNOSIS — R42 Dizziness and giddiness: Secondary | ICD-10-CM | POA: Diagnosis not present

## 2019-10-20 DIAGNOSIS — R5383 Other fatigue: Secondary | ICD-10-CM | POA: Diagnosis not present

## 2019-11-08 DIAGNOSIS — H353132 Nonexudative age-related macular degeneration, bilateral, intermediate dry stage: Secondary | ICD-10-CM | POA: Diagnosis not present

## 2019-11-08 DIAGNOSIS — Q141 Congenital malformation of retina: Secondary | ICD-10-CM | POA: Diagnosis not present

## 2019-11-08 DIAGNOSIS — H43811 Vitreous degeneration, right eye: Secondary | ICD-10-CM | POA: Diagnosis not present

## 2020-02-06 DIAGNOSIS — H5712 Ocular pain, left eye: Secondary | ICD-10-CM | POA: Diagnosis not present

## 2020-02-20 DIAGNOSIS — H04123 Dry eye syndrome of bilateral lacrimal glands: Secondary | ICD-10-CM | POA: Diagnosis not present

## 2020-02-20 DIAGNOSIS — H524 Presbyopia: Secondary | ICD-10-CM | POA: Diagnosis not present

## 2020-02-20 DIAGNOSIS — H26103 Unspecified traumatic cataract, bilateral: Secondary | ICD-10-CM | POA: Diagnosis not present

## 2020-03-30 DIAGNOSIS — Z23 Encounter for immunization: Secondary | ICD-10-CM | POA: Diagnosis not present

## 2020-04-11 DIAGNOSIS — Z20822 Contact with and (suspected) exposure to covid-19: Secondary | ICD-10-CM | POA: Diagnosis not present

## 2020-04-11 DIAGNOSIS — Z03818 Encounter for observation for suspected exposure to other biological agents ruled out: Secondary | ICD-10-CM | POA: Diagnosis not present

## 2020-06-09 DIAGNOSIS — Z20822 Contact with and (suspected) exposure to covid-19: Secondary | ICD-10-CM | POA: Diagnosis not present

## 2020-06-09 DIAGNOSIS — Z03818 Encounter for observation for suspected exposure to other biological agents ruled out: Secondary | ICD-10-CM | POA: Diagnosis not present

## 2020-07-31 DIAGNOSIS — L814 Other melanin hyperpigmentation: Secondary | ICD-10-CM | POA: Diagnosis not present

## 2020-07-31 DIAGNOSIS — D2272 Melanocytic nevi of left lower limb, including hip: Secondary | ICD-10-CM | POA: Diagnosis not present

## 2020-07-31 DIAGNOSIS — D2261 Melanocytic nevi of right upper limb, including shoulder: Secondary | ICD-10-CM | POA: Diagnosis not present

## 2020-07-31 DIAGNOSIS — D225 Melanocytic nevi of trunk: Secondary | ICD-10-CM | POA: Diagnosis not present

## 2020-07-31 DIAGNOSIS — L821 Other seborrheic keratosis: Secondary | ICD-10-CM | POA: Diagnosis not present

## 2020-07-31 DIAGNOSIS — D2262 Melanocytic nevi of left upper limb, including shoulder: Secondary | ICD-10-CM | POA: Diagnosis not present

## 2020-07-31 DIAGNOSIS — D1801 Hemangioma of skin and subcutaneous tissue: Secondary | ICD-10-CM | POA: Diagnosis not present

## 2020-08-05 DIAGNOSIS — Z1231 Encounter for screening mammogram for malignant neoplasm of breast: Secondary | ICD-10-CM | POA: Diagnosis not present

## 2020-08-26 DIAGNOSIS — M8589 Other specified disorders of bone density and structure, multiple sites: Secondary | ICD-10-CM | POA: Diagnosis not present

## 2020-08-26 DIAGNOSIS — E785 Hyperlipidemia, unspecified: Secondary | ICD-10-CM | POA: Diagnosis not present

## 2020-08-26 DIAGNOSIS — E041 Nontoxic single thyroid nodule: Secondary | ICD-10-CM | POA: Diagnosis not present

## 2020-08-26 DIAGNOSIS — M48061 Spinal stenosis, lumbar region without neurogenic claudication: Secondary | ICD-10-CM | POA: Diagnosis not present

## 2020-08-26 DIAGNOSIS — M546 Pain in thoracic spine: Secondary | ICD-10-CM | POA: Diagnosis not present

## 2020-09-03 DIAGNOSIS — D649 Anemia, unspecified: Secondary | ICD-10-CM | POA: Diagnosis not present

## 2020-09-03 DIAGNOSIS — Z0000 Encounter for general adult medical examination without abnormal findings: Secondary | ICD-10-CM | POA: Diagnosis not present

## 2020-09-03 DIAGNOSIS — N1831 Chronic kidney disease, stage 3a: Secondary | ICD-10-CM | POA: Diagnosis not present

## 2020-09-03 DIAGNOSIS — M19041 Primary osteoarthritis, right hand: Secondary | ICD-10-CM | POA: Diagnosis not present

## 2020-09-03 DIAGNOSIS — I6521 Occlusion and stenosis of right carotid artery: Secondary | ICD-10-CM | POA: Diagnosis not present

## 2020-09-03 DIAGNOSIS — R82998 Other abnormal findings in urine: Secondary | ICD-10-CM | POA: Diagnosis not present

## 2020-09-03 DIAGNOSIS — Z1331 Encounter for screening for depression: Secondary | ICD-10-CM | POA: Diagnosis not present

## 2020-09-03 DIAGNOSIS — B009 Herpesviral infection, unspecified: Secondary | ICD-10-CM | POA: Diagnosis not present

## 2020-09-03 DIAGNOSIS — M8580 Other specified disorders of bone density and structure, unspecified site: Secondary | ICD-10-CM | POA: Diagnosis not present

## 2020-09-03 DIAGNOSIS — Z1339 Encounter for screening examination for other mental health and behavioral disorders: Secondary | ICD-10-CM | POA: Diagnosis not present

## 2020-09-03 DIAGNOSIS — R42 Dizziness and giddiness: Secondary | ICD-10-CM | POA: Diagnosis not present

## 2020-09-03 DIAGNOSIS — E785 Hyperlipidemia, unspecified: Secondary | ICD-10-CM | POA: Diagnosis not present

## 2020-09-04 DIAGNOSIS — D649 Anemia, unspecified: Secondary | ICD-10-CM | POA: Diagnosis not present

## 2020-09-04 DIAGNOSIS — R202 Paresthesia of skin: Secondary | ICD-10-CM | POA: Diagnosis not present

## 2020-11-07 DIAGNOSIS — Z20822 Contact with and (suspected) exposure to covid-19: Secondary | ICD-10-CM | POA: Diagnosis not present

## 2021-03-20 DIAGNOSIS — H524 Presbyopia: Secondary | ICD-10-CM | POA: Diagnosis not present

## 2021-03-20 DIAGNOSIS — H353132 Nonexudative age-related macular degeneration, bilateral, intermediate dry stage: Secondary | ICD-10-CM | POA: Diagnosis not present

## 2021-03-20 DIAGNOSIS — H2513 Age-related nuclear cataract, bilateral: Secondary | ICD-10-CM | POA: Diagnosis not present

## 2021-05-07 ENCOUNTER — Other Ambulatory Visit: Payer: Self-pay

## 2021-05-07 ENCOUNTER — Ambulatory Visit: Payer: Medicare PPO | Admitting: Orthopaedic Surgery

## 2021-05-07 ENCOUNTER — Ambulatory Visit: Payer: Self-pay

## 2021-05-07 DIAGNOSIS — G8929 Other chronic pain: Secondary | ICD-10-CM | POA: Diagnosis not present

## 2021-05-07 DIAGNOSIS — M25552 Pain in left hip: Secondary | ICD-10-CM

## 2021-05-07 DIAGNOSIS — M7062 Trochanteric bursitis, left hip: Secondary | ICD-10-CM | POA: Diagnosis not present

## 2021-05-07 DIAGNOSIS — M4807 Spinal stenosis, lumbosacral region: Secondary | ICD-10-CM

## 2021-05-07 DIAGNOSIS — M5442 Lumbago with sciatica, left side: Secondary | ICD-10-CM

## 2021-05-07 DIAGNOSIS — M79605 Pain in left leg: Secondary | ICD-10-CM

## 2021-05-07 MED ORDER — LIDOCAINE HCL 1 % IJ SOLN
3.0000 mL | INTRAMUSCULAR | Status: AC | PRN
  Administered 2021-05-07: 3 mL

## 2021-05-07 MED ORDER — METHYLPREDNISOLONE ACETATE 40 MG/ML IJ SUSP
40.0000 mg | INTRAMUSCULAR | Status: AC | PRN
  Administered 2021-05-07: 40 mg via INTRA_ARTICULAR

## 2021-05-07 NOTE — Progress Notes (Signed)
Office Visit Note   Patient: Beth Mcdowell           Date of Birth: 1950/10/25           MRN: [id number] Visit Date: 05/07/2021              Requested by: Cleatis Polka., MD [address] [place],  [place] [address] PCP: Cleatis Polka., MD   Assessment & Plan: Visit Diagnoses:  1. Pain in left hip   2. Pain in left leg   3. Trochanteric bursitis, left hip   4. Chronic left-sided low back pain with left-sided sciatica     Plan: I did provide a steroid injection of her left hip trochanteric area to see how this will help.  She definitely needs an MRI of the lumbar spine to assess the degree of stenosis and to see was worsening for her given the severity of her plain film findings especially with the chronic disc space narrowing at L3-L4.  We will see her back after this MRI.  Follow-Up Instructions: No follow-ups on file.   Orders:  Orders Placed This Encounter  Procedures   XR HIP UNILAT W OR W/O PELVIS 1V LEFT   XR Lumbar Spine 2-3 Views   No orders of the defined types were placed in this encounter.     Procedures: Large Joint Inj: L greater trochanter on 05/07/2021 2:51 PM Indications: pain and diagnostic evaluation Details: 22 G 1.5 in needle, lateral approach  Arthrogram: No  Medications: 3 mL lidocaine 1 %; 40 mg methylPREDNISolone acetate 40 MG/ML Outcome: tolerated well, no immediate complications Procedure, treatment alternatives, risks and benefits explained, specific risks discussed. Consent was given by the patient. Immediately prior to procedure a time out was called to verify the correct patient, procedure, equipment, support staff and site/side marked as required. Patient was prepped and draped in the usual sterile fashion.      Clinical Data: No additional findings.   Subjective: Chief Complaint  Patient presents with   Left Hip - Pain  The patient is someone well-known to me.  We actually replaced her right hip back in 2014.  She has  been having left hip pain for about 6 months now but denies any groin pain.  We have diagnosed in the past with some mild arthritis in her left hip but her hip pain now is on the lateral aspect of her hip and mainly in her lower back and sciatic region.  She has a remote history of spinal stenosis surgery with no instrumentation by Dr. Wyline Mood in [place].  She said that helped her greatly at the time when she needed that surgery.  She denies any change in bowel bladder function or weakness in her legs.  She does have lateral hip pain on the left side of her hip when she lays on that side at night.  She has tried Voltaren gel.  She is 70 years old and thin.  She is very active and does not have any acute medical issues.  She denies any weakness in her legs or numbness and tingling going down her legs.  HPI  Review of Systems There is currently listed no headache, chest pain, shortness of breath, fever, chills, nausea, vomiting  Objective: Vital Signs: There were no vitals taken for this visit.  Physical Exam She is alert and orient x3 and in no acute distress Ortho Exam Examination of her left hip shows full internal and external rotation  with pain only over the trochanteric area of the hip.  She does have a positive straight leg raise on the left side and pain in the lower aspect of the lumbar spine with flexion and extension all to the left side.  She has good strength in her lower extremities. Specialty Comments:  No specialty comments available.  Imaging: XR HIP UNILAT W OR W/O PELVIS 1V LEFT  Result Date: 05/07/2021 An AP pelvis and lateral left hip shows a well-seated total hip arthroplasty on the right side.  There is mild arthritic changes with superior lateral joint space narrowing on the left side.  This is not changed significantly when compared to previous films from several years ago.  XR Lumbar Spine 2-3 Views  Result Date: 05/07/2021 2 views of the lumbar spine show severe  degenerative disc disease between L3 and L4.  There is also significant arthritic changes in the posterior elements of the lower lumbar spine.    PMFS History: Patient Active Problem List   Diagnosis Date Noted   Degenerative arthritis of right hip 06/02/2013   Status post THR (total hip replacement) 06/02/2013   Past Medical History:  Diagnosis Date   Anemia    SOMETIMES   Arthritis    hips   Muscle spasm    SPASMS BOTH LEGS AND FEET -FROM LUMBAR STENOSIS   Spinal stenosis    Hx: of   Thyroid disease 1980   nodule- treated w/ synthroid for a couple of years    Family History  Problem Relation Age of Onset   Heart disease Mother    Heart disease Father    Cancer Brother    Hypertension Other     Past Surgical History:  Procedure Laterality Date   BACK SURGERY     Hx: of lumbar   BREAST BIOPSY     Hx: of left breast 2/ 2014   BREAST BIOPSY Left 02/20/2013   Procedure: Left breast wire guided excision;  Surgeon: Emelia Loron, MD;  Location: [facility] OR;  Service: General;  Laterality: Left;   COLONOSCOPY     Hx: of   DILATION AND CURETTAGE OF UTERUS     OVARIAN CYST REMOVAL  1977, 1980   TOTAL HIP ARTHROPLASTY Right 06/02/2013   Procedure: RIGHT TOTAL HIP ARTHROPLASTY ANTERIOR APPROACH;  Surgeon: Kathryne Hitch, MD;  Location: [facility] ORS;  Service: Orthopedics;  Laterality: Right;   X-STOP IMPLANTATION  02/2012   Social History   Occupational History   Not on file  Tobacco Use   Smoking status: Never   Smokeless tobacco: Never  Substance and Sexual Activity   Alcohol use: Yes    Comment: 3 glasses/week   Drug use: No   Sexual activity: Not on file

## 2021-06-05 ENCOUNTER — Other Ambulatory Visit: Payer: Self-pay

## 2021-06-05 ENCOUNTER — Ambulatory Visit
Admission: RE | Admit: 2021-06-05 | Discharge: 2021-06-05 | Disposition: A | Discharge status: Home / Self Care | Payer: Medicare PPO | Source: Ambulatory Visit | Attending: Orthopaedic Surgery | Admitting: Orthopaedic Surgery

## 2021-06-05 DIAGNOSIS — M25552 Pain in left hip: Secondary | ICD-10-CM | POA: Diagnosis not present

## 2021-06-05 DIAGNOSIS — R200 Anesthesia of skin: Secondary | ICD-10-CM | POA: Diagnosis not present

## 2021-06-05 DIAGNOSIS — M4807 Spinal stenosis, lumbosacral region: Secondary | ICD-10-CM

## 2021-06-05 DIAGNOSIS — M48061 Spinal stenosis, lumbar region without neurogenic claudication: Secondary | ICD-10-CM | POA: Diagnosis not present

## 2021-06-05 DIAGNOSIS — M545 Low back pain, unspecified: Secondary | ICD-10-CM | POA: Diagnosis not present

## 2021-06-09 ENCOUNTER — Encounter: Payer: Self-pay | Admitting: Orthopaedic Surgery

## 2021-06-09 ENCOUNTER — Other Ambulatory Visit: Payer: Self-pay

## 2021-06-09 ENCOUNTER — Ambulatory Visit (INDEPENDENT_AMBULATORY_CARE_PROVIDER_SITE_OTHER): Payer: Medicare PPO | Admitting: Orthopaedic Surgery

## 2021-06-09 DIAGNOSIS — M4807 Spinal stenosis, lumbosacral region: Secondary | ICD-10-CM

## 2021-06-09 DIAGNOSIS — M7062 Trochanteric bursitis, left hip: Secondary | ICD-10-CM | POA: Diagnosis not present

## 2021-06-09 NOTE — Progress Notes (Signed)
The patient comes in with 2 different issues.  One, she is following up after having a steroid injection in her left hip trochanteric area.  She says that the steroid injection helped her quite a bit with the left hip.  She still been dealing with left-sided sciatica and left-sided low back pain.  She is following up after having a MRI of her lumbar spine.  Again she states that the left hip trochanteric injection is done great for her.  Her biggest issues getting comfortable sleep at night.  The MRI of her lumbar spine does show a small extraforaminal disc bulge to the left-sided L3/L4.  It looks like it is in close proximity of the L3 nerve root and there is foraminal stenosis at this area that is multifactorial.  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  The next option for her would be setting her up for outpatient physical therapy or any modalities that can help decrease her low back pain and give her home exercise program as well.  I talked about the possibility of gabapentin at night and she wants to hold off on this.  She has had epidural steroid injections by another physiatrist in town and I recommended that she contact him to consider a left L3 epidural versus facet joint injections if this does not help.  I will see her back in 6 weeks to see how she is doing overall.  All questions and concerns were answered and addressed.

## 2021-07-03 ENCOUNTER — Other Ambulatory Visit: Payer: Self-pay

## 2021-07-03 ENCOUNTER — Encounter: Payer: Self-pay | Admitting: Rehabilitative and Restorative Service Providers"

## 2021-07-03 ENCOUNTER — Ambulatory Visit: Payer: Medicare PPO | Admitting: Rehabilitative and Restorative Service Providers"

## 2021-07-03 DIAGNOSIS — M6281 Muscle weakness (generalized): Secondary | ICD-10-CM

## 2021-07-03 DIAGNOSIS — M5442 Lumbago with sciatica, left side: Secondary | ICD-10-CM | POA: Diagnosis not present

## 2021-07-03 DIAGNOSIS — G8929 Other chronic pain: Secondary | ICD-10-CM

## 2021-07-03 DIAGNOSIS — M25552 Pain in left hip: Secondary | ICD-10-CM | POA: Diagnosis not present

## 2021-07-03 DIAGNOSIS — R262 Difficulty in walking, not elsewhere classified: Secondary | ICD-10-CM

## 2021-07-03 NOTE — Therapy (Addendum)
[facility] [address] [place], [place], [address] Phone: [phone]   Fax:  [phone]  Physical Therapy Evaluation  Patient Details  Name: Beth Mcdowell MRN: [id number] Date of Birth: [birth year] Referring Provider (PT): Kathryne Hitch, MD   Encounter Date: 07/03/2021    07/03/21 1055  PT Visits / Re-Eval  Visit Number 1  Number of Visits 20  Date for PT Re-Evaluation 09/11/21  Authorization  Authorization Type [employer] $20  Authorization Time Period asking through 09/11/2021  Progress Note Due on Visit 10  PT Time Calculation  PT Start Time 1105  PT Stop Time 1145  PT Time Calculation (min) 40 min  PT - End of Session  Activity Tolerance Patient tolerated treatment well  Behavior During Therapy WFL for tasks assessed/performed      Past Medical History:  Diagnosis Date   Anemia    SOMETIMES   Arthritis    hips   Muscle spasm    SPASMS BOTH LEGS AND FEET -FROM LUMBAR STENOSIS   Spinal stenosis    Hx: of   Thyroid disease 1980   nodule- treated w/ synthroid for a couple of years    Past Surgical History:  Procedure Laterality Date   BACK SURGERY     Hx: of lumbar   BREAST BIOPSY     Hx: of left breast 2/ 2014   BREAST BIOPSY Left 02/20/2013   Procedure: Left breast wire guided excision;  Surgeon: Emelia Loron, MD;  Location: [facility];  Service: General;  Laterality: Left;   COLONOSCOPY     Hx: of   DILATION AND CURETTAGE OF UTERUS     OVARIAN CYST REMOVAL  1977, 1980   TOTAL HIP ARTHROPLASTY Right 06/02/2013   Procedure: RIGHT TOTAL HIP ARTHROPLASTY ANTERIOR APPROACH;  Surgeon: Kathryne Hitch, MD;  Location: [facility];  Service: Orthopedics;  Laterality: Right;   X-STOP IMPLANTATION  02/2012    There were no vitals filed for this visit.    Subjective Assessment - 07/03/21 1055     Subjective Pt. indicated a stenosis surgery L4/L5 several years ago with improvement noted (no hardware).  Pt. indicated  she tries to stay activity and uses a trainer.  Pt. stated having pain in Lt hip started and had injection that helped symptoms.  Pt. indicated complaints current in posterior Lt hip region/lumbar region.  Pt. indicated pain noted but chief problem is trouble sleeping due to symptoms.   Pt. stated position of lying doesn't help symptoms.  Pt. indicated she wants to be sure if lifting heavy impacts the symptoms and learn about what she should or shouldn't do.  Pt. indicated taking it easy in last few weeks and felt some improvement in symptoms c rest periods.    Pertinent History History of injection Lt hip with improvement noted.  History of Rt THA    Limitations Other (comment);Walking   lying down/sleeping   Diagnostic tests MRI lumbar showing small disc buldge Lt L3/L4, foraminal stenosis    Patient Stated Goals Reduce pain, improve sleep, learn what to do or not to do    Currently in Pain? Yes    Pain Score 5    at worst normally   Pain Location Back   Lt hip   Pain Orientation Left    Pain Descriptors / Indicators Dull    Pain Type Chronic pain    Pain Onset More than a month ago    Pain Frequency Intermittent    Aggravating  Factors  lying down, stairs, uphill walking    Pain Relieving Factors injection    Effect of Pain on Daily Activities Limited in walking, sleeping                [facility] PT Assessment - 07/03/21 0001       Assessment   Medical Diagnosis M48.07 (ICD-10-CM) - Spinal stenosis of lumbosacral region    Referring Provider (PT) Kathryne HitchBlackman, Christopher Y, MD    Onset Date/Surgical Date 04/29/21    Hand Dominance Right      Precautions   Precautions None      Restrictions   Weight Bearing Restrictions No      Balance Screen   Has the patient fallen in the past 6 months No    Has the patient had a decrease in activity level because of a fear of falling?  No    Is the patient reluctant to leave their home because of a fear of falling?  No      Home Curatornvironment    Living Environment Private residence    Additional Comments 2nd store bedroom with rail on Rt side      Prior Function   Level of Independence Independent    Leisure workouts, walking, kayaking      Cognition   Overall Cognitive Status Within Functional Limits for tasks assessed      Observation/Other Assessments   Focus on Therapeutic Outcomes (FOTO)  intake: 61 predicted 71%      Sensation   Light Touch Appears Intact      Posture/Postural Control   Posture/Postural Control No significant limitations      ROM / Strength   AROM / PROM / Strength AROM;PROM;Strength      AROM   Overall AROM Comments No specific change in symptoms noted c lumbar mobility assessment    AROM Assessment Site Lumbar;Hip    Right/Left Hip Right;Left    Lumbar Flexion movements to toes without complaints    Lumbar Extension 75% WFL no complaints    Lumbar - Right Side Bend to knee joint with no complaints    Lumbar - Left Side Bend to knee joint with no complaints      PROM   PROM Assessment Site Hip    Right/Left Hip Left;Right    Right Hip Internal Rotation  30   in supine Rt hip flexion 90 deg   Left Hip Internal Rotation  20   in supine 90 deg flexion, end range symptoms     Strength   Strength Assessment Site Knee;Hip;Ankle    Right/Left Hip Left;Right    Right Hip Flexion 5/5    Right Hip Extension 5/5    Right Hip ABduction 5/5    Left Hip Flexion 5/5    Left Hip Extension 4/5    Left Hip ABduction 4/5    Right/Left Knee Left;Right    Right Knee Flexion 5/5    Right Knee Extension 5/5    Left Knee Flexion 5/5    Left Knee Extension 5/5    Right/Left Ankle Right;Left    Right Ankle Dorsiflexion 5/5    Left Ankle Dorsiflexion 5/5      Palpation   Palpation comment Trigger points in Lt glute max, piriformis c concordant symptoms      Transfers   Comments 18 inch chair sit to stand to sit unrestricted s UE assist      Ambulation/Gait   Gait Comments Mild trendelenburg on Lt  in  stance phase                        Objective measurements completed on examination: See above findings.       [facility] Adult PT Treatment/Exercise - 07/03/21 0001       Exercises   Exercises Other Exercises    Other Exercises  HEP instruction/performance c cues for techniques, handout provided.  Trial set performed of each for comprehension and symptom assessment.  Consisting of supine bridge, supine piriformis stretch figure 4 flexion, sidelying hip abduction      Manual Therapy   Manual therapy comments compression to Lt glute max, skilled palpation during DN              Trigger Point Dry Needling - 07/03/21 0001     Consent Given? Yes    Education Handout Provided No   give next visit   Muscles Treated Back/Hip Gluteus maximus;Piriformis   Lt   Gluteus Maximus Response Twitch response elicited    Piriformis Response Twitch response elicited                   PT Education - 07/03/21 1055     Education Details HEP, POC    Person(s) Educated Patient    Methods Explanation;Demonstration;Verbal cues;Handout    Comprehension Returned demonstration;Verbalized understanding              PT Short Term Goals - 07/03/21 1056       PT SHORT TERM GOAL #1   Title Patient will demonstrate independent use of home exercise program to maintain progress from in clinic treatments.    Time 3    Period Weeks    Status New    Target Date 07/24/21               PT Long Term Goals - 07/03/21 1056       PT LONG TERM GOAL #1   Title Patient will demonstrate/report pain at worst less than or equal to 2/10 to facilitate minimal limitation in daily activity secondary to pain symptoms.    Time 10    Period Weeks    Status New    Target Date 09/11/21      PT LONG TERM GOAL #2   Title Patient will demonstrate independent use of home exercise program to facilitate ability to maintain/progress functional gains from skilled physical therapy  services.    Time 10    Period Weeks    Status New    Target Date 09/11/21      PT LONG TERM GOAL #3   Title Pt. will demonstrate FOTO outcome > or = 71 % to indicated reduced disability due to condition.    Time 10    Period Weeks    Status New    Target Date 09/11/21      PT LONG TERM GOAL #4   Title Pt. will demonstrate Lt hip MMT 5/5 throughout to facilitate usual stairs, walking and exercise at PLOF.    Time 10    Period Weeks    Status New    Target Date 09/11/21      PT LONG TERM GOAL #5   Title Pt. will demonstrate reciprocal gait pattern up/down stairs s UE assist without symptom for household navigation.    Time 10    Period Weeks    Status New    Target Date 09/11/21  Plan - 07/03/21 1154     Clinical Impression Statement Patient is a 71 y.o. who comes to clinic with complaints of Lt hip/low back pain with mobility, strength and movement coordination deficits that impair their ability to perform usual daily and recreational functional activities without increase difficulty/symptoms at this time.  Patient to benefit from skilled PT services to address impairments and limitations to improve to previous level of function without restriction secondary to condition.    Personal Factors and Comorbidities Other   history of Rt TKA, lumbar laser surgery in past   Examination-Activity Limitations Sleep;Stairs;Stand;Locomotion Level    Examination-Participation Restrictions Community Activity;Shop;Other   exercise   Stability/Clinical Decision Making Stable/Uncomplicated    Clinical Decision Making Low    Rehab Potential Good    PT Frequency Other (comment)   1-2x/week   PT Duration Other (comment)   10 weeks   PT Treatment/Interventions ADLs/Self Care Home Management;Cryotherapy;Electrical Stimulation;Iontophoresis 4mg /ml Dexamethasone;Moist Heat;Balance training;Therapeutic exercise;Therapeutic activities;Functional mobility training;Stair  training;Gait training;DME Instruction;Ultrasound;Neuromuscular re-education;Patient/family education;Passive range of motion;Spinal Manipulations;Joint Manipulations;Dry needling;Taping;Manual techniques    PT Next Visit Plan DN reassessment for use in future, progressive hip strengthening/stability.    PT Home Exercise Plan [id number]    Consulted and Agree with Plan of Care Patient             Patient will benefit from skilled therapeutic intervention in order to improve the following deficits and impairments:  Abnormal gait, Decreased endurance, Hypomobility, Decreased activity tolerance, Decreased strength, Pain, Difficulty walking, Increased fascial restricitons, Decreased mobility, Decreased balance, Decreased range of motion, Impaired perceived functional ability, Improper body mechanics, Impaired flexibility, Decreased coordination  Visit Diagnosis: Chronic bilateral low back pain with left-sided sciatica  Pain in left hip  Muscle weakness (generalized)  Difficulty in walking, not elsewhere classified     Problem List Patient Active Problem List   Diagnosis Date Noted   Degenerative arthritis of right hip 06/02/2013   Status post THR (total hip replacement) 06/02/2013    Chyrel MassonMichael Berish Bohman, PT, DPT, OCS, ATC 07/03/21  12:08 PM  Referring diagnosis? M48.07  Treatment diagnosis? (if different than referring diagnosis) M54.42 What was this (referring dx) caused by? []  Surgery []  Fall [x]  Ongoing issue []  Arthritis []  Other: ____________  Laterality: []  Rt []  Lt [x]  Both  Check all possible CPT codes:  *CHOOSE 10 OR LESS*    [x]  97110 (Therapeutic Exercise)  []  92507 (SLP Treatment)  [x]  97112 (Neuro Re-ed)   []  92526 (Swallowing Treatment)   [x]  97116 (Gait Training)   []  [id number] (Cognitive Training, 1st 15 minutes) [x]  97140 (Manual Therapy)   []  97130 (Cognitive Training, each add'l 15 minutes)  [x]  97530 (Therapeutic Activities)  []  Other, List CPT Code  ____________    [x]  [id number] (Self Care)       [x]  All codes above (97110 - 97535)  [x]  97012 (Mechanical Traction)  [x]  97014 (E-stim Unattended)  []  97032 (E-stim manual)  []  97033 (Ionto)  []  [id number] (Ultrasound)  []  97760 (Orthotic Fit) [x]  [id number] (Physical Performance Training) []  [id number] (Aquatic Therapy) []  97034 (Contrast Bath) []  [id number] (Paraffin) []  97597 (Wound Care 1st 20 sq cm) []  97598 (Wound Care each add'l 20 sq cm) []  97016 (Vasopneumatic Device) []  [id number] Public affairs consultant(Orthotic Training) []  [id number] (Prosthetic Training)     [facility] [address] [place], [place], [address] Phone: [phone]   Fax:  [phone]  Name: Beth Mcdowell MRN: [id number] Date of Birth: 1951/03/29

## 2021-07-11 ENCOUNTER — Ambulatory Visit: Payer: Medicare PPO | Admitting: Rehabilitative and Restorative Service Providers"

## 2021-07-11 ENCOUNTER — Encounter: Payer: Self-pay | Admitting: Rehabilitative and Restorative Service Providers"

## 2021-07-11 ENCOUNTER — Other Ambulatory Visit: Payer: Self-pay

## 2021-07-11 DIAGNOSIS — R262 Difficulty in walking, not elsewhere classified: Secondary | ICD-10-CM | POA: Diagnosis not present

## 2021-07-11 DIAGNOSIS — G8929 Other chronic pain: Secondary | ICD-10-CM

## 2021-07-11 DIAGNOSIS — M5442 Lumbago with sciatica, left side: Secondary | ICD-10-CM

## 2021-07-11 DIAGNOSIS — M6281 Muscle weakness (generalized): Secondary | ICD-10-CM

## 2021-07-11 DIAGNOSIS — M25552 Pain in left hip: Secondary | ICD-10-CM | POA: Diagnosis not present

## 2021-07-11 NOTE — Therapy (Signed)
[facility] [address] [place], [place], [address] Phone: [phone]   Fax:  [phone]  Physical Therapy Treatment  Patient Details  Name: Beth Mcdowell MRN: [id number] Date of Birth: 1951-06-28 Referring Provider (PT): Kathryne Hitch, MD   Encounter Date: 07/11/2021   PT End of Session - 07/11/21 0847     Visit Number 2    Number of Visits 20    Date for PT Re-Evaluation 09/11/21    Authorization Type [employer] $20    Authorization Time Period asking through 09/11/2021    Progress Note Due on Visit 10    PT Start Time 0847    PT Stop Time 0925    PT Time Calculation (min) 38 min    Activity Tolerance Patient tolerated treatment well    Behavior During Therapy WFL for tasks assessed/performed             Past Medical History:  Diagnosis Date   Anemia    SOMETIMES   Arthritis    hips   Muscle spasm    SPASMS BOTH LEGS AND FEET -FROM LUMBAR STENOSIS   Spinal stenosis    Hx: of   Thyroid disease 1980   nodule- treated w/ synthroid for a couple of years    Past Surgical History:  Procedure Laterality Date   BACK SURGERY     Hx: of lumbar   BREAST BIOPSY     Hx: of left breast 2/ 2014   BREAST BIOPSY Left 02/20/2013   Procedure: Left breast wire guided excision;  Surgeon: Emelia Loron, MD;  Location: [facility];  Service: General;  Laterality: Left;   COLONOSCOPY     Hx: of   DILATION AND CURETTAGE OF UTERUS     OVARIAN CYST REMOVAL  1977, 1980   TOTAL HIP ARTHROPLASTY Right 06/02/2013   Procedure: RIGHT TOTAL HIP ARTHROPLASTY ANTERIOR APPROACH;  Surgeon: Kathryne Hitch, MD;  Location: [facility];  Service: Orthopedics;  Laterality: Right;   X-STOP IMPLANTATION  02/2012    There were no vitals filed for this visit.   Subjective Assessment - 07/11/21 0854     Subjective Pt. indicated feeling good improvement and no pain since last visit.  Pt. indicated she hasn't done much other exercise because of going to  the beach.  Pt. indicated she felt like she made good progress and wasn't sure if she needed more treatment.    Pertinent History History of injection Lt hip with improvement noted.  History of Rt THA    Limitations Other (comment);Walking   lying down/sleeping   Diagnostic tests MRI lumbar showing small disc buldge Lt L3/L4, foraminal stenosis    Patient Stated Goals Reduce pain, improve sleep, learn what to do or not to do    Currently in Pain? No/denies    Pain Score 0-No pain    Pain Onset More than a month ago                [facility] PT Assessment - 07/11/21 0001       Assessment   Medical Diagnosis M48.07 (ICD-10-CM) - Spinal stenosis of lumbosacral region    Referring Provider (PT) Kathryne Hitch, MD    Onset Date/Surgical Date 04/29/21    Hand Dominance Right      Observation/Other Assessments   Focus on Therapeutic Outcomes (FOTO)  71 update  [facility] Adult PT Treatment/Exercise - 07/11/21 0001       Exercises   Exercises Knee/Hip    Other Exercises  HEP review as noted      Knee/Hip Exercises: Stretches   Piriformis Stretch Left;Both;30 seconds;5 reps   figure 4 pulling into flexion     Knee/Hip Exercises: Aerobic   Tread Mill Speed 3.0 level 5 mins, incline 2.0 5 mins, 1 min cool down      Knee/Hip Exercises: Standing   Other Standing Knee Exercises standing hip hike c hip abduction into doorway 5 sec hold x 10 bilateral      Knee/Hip Exercises: Supine   Bridges 15 reps;Both    Single Leg Bridge 1 set;15 reps;Both                       PT Short Term Goals - 07/11/21 0926       PT SHORT TERM GOAL #1   Title Patient will demonstrate independent use of home exercise program to maintain progress from in clinic treatments.    Time 3    Period Weeks    Status Achieved    Target Date 07/24/21               PT Long Term Goals - 07/03/21 1056       PT LONG TERM GOAL #1   Title Patient  will demonstrate/report pain at worst less than or equal to 2/10 to facilitate minimal limitation in daily activity secondary to pain symptoms.    Time 10    Period Weeks    Status New    Target Date 09/11/21      PT LONG TERM GOAL #2   Title Patient will demonstrate independent use of home exercise program to facilitate ability to maintain/progress functional gains from skilled physical therapy services.    Time 10    Period Weeks    Status New    Target Date 09/11/21      PT LONG TERM GOAL #3   Title Pt. will demonstrate FOTO outcome > or = 71 % to indicated reduced disability due to condition.    Time 10    Period Weeks    Status New    Target Date 09/11/21      PT LONG TERM GOAL #4   Title Pt. will demonstrate Lt hip MMT 5/5 throughout to facilitate usual stairs, walking and exercise at PLOF.    Time 10    Period Weeks    Status New    Target Date 09/11/21      PT LONG TERM GOAL #5   Title Pt. will demonstrate reciprocal gait pattern up/down stairs s UE assist without symptom for household navigation.    Time 10    Period Weeks    Status New    Target Date 09/11/21                   Plan - 07/11/21 0855     Clinical Impression Statement Pain symptoms improved per Pt. report.  After discussion, plan to have return visit in approx. 2 weeks to check status.  Pt. continue to benefit from progressive strengthening included into plan.    Personal Factors and Comorbidities Other   history of Rt TKA, lumbar laser surgery in past   Examination-Activity Limitations Sleep;Stairs;Stand;Locomotion Level    Examination-Participation Restrictions Community Activity;Shop;Other   exercise   Stability/Clinical Decision Making Stable/Uncomplicated    Rehab Potential Good  PT Frequency Other (comment)   1-2x/week   PT Duration Other (comment)   10 weeks   PT Treatment/Interventions ADLs/Self Care Home Management;Cryotherapy;Electrical Stimulation;Iontophoresis 4mg /ml  Dexamethasone;Moist Heat;Balance training;Therapeutic exercise;Therapeutic activities;Functional mobility training;Stair training;Gait training;DME Instruction;Ultrasound;Neuromuscular re-education;Patient/family education;Passive range of motion;Spinal Manipulations;Joint Manipulations;Dry needling;Taping;Manual techniques    PT Next Visit Plan Return in 2 weeks approx. if necessary.  DN as desired.    PT Home Exercise Plan    Consulted and Agree with Plan of Care Patient             Patient will benefit from skilled therapeutic intervention in order to improve the following deficits and impairments:  Abnormal gait, Decreased endurance, Hypomobility, Decreased activity tolerance, Decreased strength, Pain, Difficulty walking, Increased fascial restricitons, Decreased mobility, Decreased balance, Decreased range of motion, Impaired perceived functional ability, Improper body mechanics, Impaired flexibility, Decreased coordination  Visit Diagnosis: Chronic bilateral low back pain with left-sided sciatica  Pain in left hip  Muscle weakness (generalized)  Difficulty in walking, not elsewhere classified     Problem List Patient Active Problem List   Diagnosis Date Noted   Degenerative arthritis of right hip 06/02/2013   Status post THR (total hip replacement) 06/02/2013    14/10/2012, PT, DPT, OCS, ATC 07/11/21  9:27 AM   [facility] Therapy [address] [place], [place], [place] Phone: [phone]   Fax:  [phone]  Name: Beth Mcdowell MRN: [id number] Date of Birth: December 31, 1950

## 2021-07-14 ENCOUNTER — Encounter: Payer: Medicare PPO | Admitting: Rehabilitative and Restorative Service Providers"

## 2021-07-18 ENCOUNTER — Encounter: Payer: Medicare PPO | Admitting: Rehabilitative and Restorative Service Providers"

## 2021-07-21 ENCOUNTER — Ambulatory Visit: Payer: Medicare PPO | Admitting: Orthopaedic Surgery

## 2021-07-25 ENCOUNTER — Ambulatory Visit: Payer: Medicare PPO | Admitting: Rehabilitative and Restorative Service Providers"

## 2021-07-25 ENCOUNTER — Encounter: Payer: Self-pay | Admitting: Rehabilitative and Restorative Service Providers"

## 2021-07-25 ENCOUNTER — Other Ambulatory Visit: Payer: Self-pay

## 2021-07-25 DIAGNOSIS — G8929 Other chronic pain: Secondary | ICD-10-CM | POA: Diagnosis not present

## 2021-07-25 DIAGNOSIS — M5442 Lumbago with sciatica, left side: Secondary | ICD-10-CM | POA: Diagnosis not present

## 2021-07-25 DIAGNOSIS — M25552 Pain in left hip: Secondary | ICD-10-CM | POA: Diagnosis not present

## 2021-07-25 DIAGNOSIS — M6281 Muscle weakness (generalized): Secondary | ICD-10-CM

## 2021-07-25 DIAGNOSIS — R262 Difficulty in walking, not elsewhere classified: Secondary | ICD-10-CM

## 2021-07-25 NOTE — Therapy (Signed)
[facility] Physical Therapy [address] [place], [place], [address] Phone: [phone]   Fax:  [phone]  Physical Therapy Treatment  Patient Details  Name: Beth Mcdowell MRN: [id number] Date of Birth: 1950-12-14 Referring Provider (PT): Kathryne Hitch, MD   Encounter Date: 07/25/2021   PT End of Session - 07/25/21 0843     Visit Number 3    Number of Visits 20    Date for PT Re-Evaluation 09/11/21    Authorization Type [employer] $20    Authorization Time Period asking through 09/11/2021    Progress Note Due on Visit 10    PT Start Time 0843    PT Stop Time 0922    PT Time Calculation (min) 39 min    Activity Tolerance Patient tolerated treatment well    Behavior During Therapy WFL for tasks assessed/performed             Past Medical History:  Diagnosis Date   Anemia    SOMETIMES   Arthritis    hips   Muscle spasm    SPASMS BOTH LEGS AND FEET -FROM LUMBAR STENOSIS   Spinal stenosis    Hx: of   Thyroid disease 1980   nodule- treated w/ synthroid for a couple of years    Past Surgical History:  Procedure Laterality Date   BACK SURGERY     Hx: of lumbar   BREAST BIOPSY     Hx: of left breast 2/ 2014   BREAST BIOPSY Left 02/20/2013   Procedure: Left breast wire guided excision;  Surgeon: Emelia Loron, MD;  Location: [facility];  Service: General;  Laterality: Left;   COLONOSCOPY     Hx: of   DILATION AND CURETTAGE OF UTERUS     OVARIAN CYST REMOVAL  1977, 1980   TOTAL HIP ARTHROPLASTY Right 06/02/2013   Procedure: RIGHT TOTAL HIP ARTHROPLASTY ANTERIOR APPROACH;  Surgeon: Kathryne Hitch, MD;  Location: [facility];  Service: Orthopedics;  Laterality: Right;   X-STOP IMPLANTATION  02/2012    There were no vitals filed for this visit.   Subjective Assessment - 07/25/21 0845     Subjective Pt. indicated husband become crtically ill this week. Pt. indicated she had to cancel Dr. Magnus Ivan appointment this week.  Pt. indicated this  week didn't have much activity due to husband care.  Pt. indicated pain woke her up last night a few times    Pertinent History History of injection Lt hip with improvement noted.  History of Rt THA    Limitations Other (comment);Walking   lying down/sleeping   Diagnostic tests MRI lumbar showing small disc buldge Lt L3/L4, foraminal stenosis    Patient Stated Goals Reduce pain, improve sleep, learn what to do or not to do    Currently in Pain? No/denies    Pain Score 2     Pain Location Hip    Pain Orientation Left    Pain Descriptors / Indicators Dull    Pain Type Chronic pain    Pain Onset More than a month ago    Pain Frequency Intermittent    Aggravating Factors  nighttime pain    Pain Relieving Factors treatment has helped                [facility] PT Assessment - 07/25/21 0001       Assessment   Medical Diagnosis M48.07 (ICD-10-CM) - Spinal stenosis of lumbosacral region    Referring Provider (PT) Kathryne Hitch, MD  Onset Date/Surgical Date 04/29/21    Hand Dominance Right      AROM   Lumbar Extension 100% WFL      Palpation   Palpation comment Trigger points superior Lt glute max                           [facility] Adult PT Treatment/Exercise - 07/25/21 0001       Knee/Hip Exercises: Stretches   Piriformis Stretch 30 seconds;3 reps;Both    Other Knee/Hip Stretches single knee to chest 30 sec x 3 bilateral      Knee/Hip Exercises: Aerobic   Nustep Lvl 6 UE/LE 6 mins      Knee/Hip Exercises: Machines for Strengthening   Total Gym Leg Press single leg 2 x 10 50 lbs, performed bilateral      Knee/Hip Exercises: Standing   Other Standing Knee Exercises standing hip hike c hip abduction into doorway 5 sec hold x 15 bilateral      Knee/Hip Exercises: Supine   Bridges Both;2 sets;10 reps      Knee/Hip Exercises: Sidelying   Hip ABduction Left;3 sets;10 reps      Manual Therapy   Manual therapy comments compression to Lt glute max,  skilled palpation during DN              Trigger Point Dry Needling - 07/25/21 0001     Consent Given? Yes    Education Handout Provided Yes    Muscles Treated Back/Hip Gluteus maximus   Lt   Gluteus Maximus Response Twitch response elicited    Piriformis Response --                     PT Short Term Goals - 07/11/21 0926       PT SHORT TERM GOAL #1   Title Patient will demonstrate independent use of home exercise program to maintain progress from in clinic treatments.    Time 3    Period Weeks    Status Achieved    Target Date 07/24/21               PT Long Term Goals - 07/03/21 1056       PT LONG TERM GOAL #1   Title Patient will demonstrate/report pain at worst less than or equal to 2/10 to facilitate minimal limitation in daily activity secondary to pain symptoms.    Time 10    Period Weeks    Status New    Target Date 09/11/21      PT LONG TERM GOAL #2   Title Patient will demonstrate independent use of home exercise program to facilitate ability to maintain/progress functional gains from skilled physical therapy services.    Time 10    Period Weeks    Status New    Target Date 09/11/21      PT LONG TERM GOAL #3   Title Pt. will demonstrate FOTO outcome > or = 71 % to indicated reduced disability due to condition.    Time 10    Period Weeks    Status New    Target Date 09/11/21      PT LONG TERM GOAL #4   Title Pt. will demonstrate Lt hip MMT 5/5 throughout to facilitate usual stairs, walking and exercise at PLOF.    Time 10    Period Weeks    Status New    Target Date 09/11/21  PT LONG TERM GOAL #5   Title Pt. will demonstrate reciprocal gait pattern up/down stairs s UE assist without symptom for household navigation.    Time 10    Period Weeks    Status New    Target Date 09/11/21                   Plan - 07/25/21 0857     Clinical Impression Statement Return of symptoms last night possibly related to reduced  HEP use in time this week as well as yesterday's return to gym activity after period of time not doing it due to husband's illness.  Twitch response of concordant symptoms noted in Lt glute max superiorly today.  Reviewed existing HEP for use at home and encouraged return to routine.    Personal Factors and Comorbidities Other   history of Rt TKA, lumbar laser surgery in past   Examination-Activity Limitations Sleep;Stairs;Stand;Locomotion Level    Examination-Participation Restrictions Community Activity;Shop;Other   exercise   Stability/Clinical Decision Making Stable/Uncomplicated    Rehab Potential Good    PT Frequency Other (comment)   1-2x/week   PT Duration Other (comment)   10 weeks   PT Treatment/Interventions ADLs/Self Care Home Management;Cryotherapy;Electrical Stimulation;Iontophoresis 4mg /ml Dexamethasone;Moist Heat;Balance training;Therapeutic exercise;Therapeutic activities;Functional mobility training;Stair training;Gait training;DME Instruction;Ultrasound;Neuromuscular re-education;Patient/family education;Passive range of motion;Spinal Manipulations;Joint Manipulations;Dry needling;Taping;Manual techniques    PT Next Visit Plan DN prn. Continued hip strengthening.    PT Home Exercise Plan    Consulted and Agree with Plan of Care Patient             Patient will benefit from skilled therapeutic intervention in order to improve the following deficits and impairments:  Abnormal gait, Decreased endurance, Hypomobility, Decreased activity tolerance, Decreased strength, Pain, Difficulty walking, Increased fascial restricitons, Decreased mobility, Decreased balance, Decreased range of motion, Impaired perceived functional ability, Improper body mechanics, Impaired flexibility, Decreased coordination  Visit Diagnosis: Chronic bilateral low back pain with left-sided sciatica  Pain in left hip  Muscle weakness (generalized)  Difficulty in walking, not elsewhere  classified     Problem List Patient Active Problem List   Diagnosis Date Noted   Degenerative arthritis of right hip 06/02/2013   Status post THR (total hip replacement) 06/02/2013   14/10/2012, PT, DPT, OCS, ATC 07/25/21  9:22 AM    [facility] Physical Therapy [address] [place], [place], [place] Phone: [phone]   Fax:  [phone]  Name: Beth Mcdowell MRN: Otho Darner Date of Birth: [birth year]

## 2021-08-05 DIAGNOSIS — D1801 Hemangioma of skin and subcutaneous tissue: Secondary | ICD-10-CM | POA: Diagnosis not present

## 2021-08-05 DIAGNOSIS — D485 Neoplasm of uncertain behavior of skin: Secondary | ICD-10-CM | POA: Diagnosis not present

## 2021-08-05 DIAGNOSIS — L812 Freckles: Secondary | ICD-10-CM | POA: Diagnosis not present

## 2021-08-05 DIAGNOSIS — D2262 Melanocytic nevi of left upper limb, including shoulder: Secondary | ICD-10-CM | POA: Diagnosis not present

## 2021-08-05 DIAGNOSIS — L853 Xerosis cutis: Secondary | ICD-10-CM | POA: Diagnosis not present

## 2021-08-05 DIAGNOSIS — L821 Other seborrheic keratosis: Secondary | ICD-10-CM | POA: Diagnosis not present

## 2021-08-05 DIAGNOSIS — D2261 Melanocytic nevi of right upper limb, including shoulder: Secondary | ICD-10-CM | POA: Diagnosis not present

## 2021-08-05 DIAGNOSIS — C44712 Basal cell carcinoma of skin of right lower limb, including hip: Secondary | ICD-10-CM | POA: Diagnosis not present

## 2021-08-08 ENCOUNTER — Ambulatory Visit: Payer: Medicare PPO | Admitting: Rehabilitative and Restorative Service Providers"

## 2021-08-08 ENCOUNTER — Other Ambulatory Visit: Payer: Self-pay

## 2021-08-08 ENCOUNTER — Encounter: Payer: Self-pay | Admitting: Rehabilitative and Restorative Service Providers"

## 2021-08-08 DIAGNOSIS — R262 Difficulty in walking, not elsewhere classified: Secondary | ICD-10-CM | POA: Diagnosis not present

## 2021-08-08 DIAGNOSIS — G8929 Other chronic pain: Secondary | ICD-10-CM

## 2021-08-08 DIAGNOSIS — M6281 Muscle weakness (generalized): Secondary | ICD-10-CM

## 2021-08-08 DIAGNOSIS — M5442 Lumbago with sciatica, left side: Secondary | ICD-10-CM | POA: Diagnosis not present

## 2021-08-08 DIAGNOSIS — M25552 Pain in left hip: Secondary | ICD-10-CM | POA: Diagnosis not present

## 2021-08-08 NOTE — Therapy (Addendum)
[facility] Physical Therapy [address] [place], [place], [address] Phone: [phone]   Fax:  [phone]  Physical Therapy Treatment  Patient Details  Name: Beth Mcdowell MRN: [id number] Date of Birth: 06/11/1951 Referring Provider (PT): Kathryne Hitch, MD   Encounter Date: 08/08/2021   PT End of Session - 08/08/21 1002     Visit Number 4    Number of Visits 20    Date for PT Re-Evaluation 09/11/21    Authorization Type [employer] $20    Authorization Time Period asking through 09/11/2021    Progress Note Due on Visit 10    PT Start Time 0929    PT Stop Time 1008    PT Time Calculation (min) 39 min    Activity Tolerance Patient tolerated treatment well    Behavior During Therapy WFL for tasks assessed/performed             Past Medical History:  Diagnosis Date   Anemia    SOMETIMES   Arthritis    hips   Muscle spasm    SPASMS BOTH LEGS AND FEET -FROM LUMBAR STENOSIS   Spinal stenosis    Hx: of   Thyroid disease 1980   nodule- treated w/ synthroid for a couple of years    Past Surgical History:  Procedure Laterality Date   BACK SURGERY     Hx: of lumbar   BREAST BIOPSY     Hx: of left breast 2/ 2014   BREAST BIOPSY Left 02/20/2013   Procedure: Left breast wire guided excision;  Surgeon: Emelia Loron, MD;  Location: [facility];  Service: General;  Laterality: Left;   COLONOSCOPY     Hx: of   DILATION AND CURETTAGE OF UTERUS     OVARIAN CYST REMOVAL  1977, 1980   TOTAL HIP ARTHROPLASTY Right 06/02/2013   Procedure: RIGHT TOTAL HIP ARTHROPLASTY ANTERIOR APPROACH;  Surgeon: Kathryne Hitch, MD;  Location: [facility];  Service: Orthopedics;  Laterality: Right;   X-STOP IMPLANTATION  02/2012    There were no vitals filed for this visit.   Subjective Assessment - 08/08/21 0931     Subjective Pt. indicated the muscle area of hips in lateral and posterior hip hasn't been hurting.  Pt. indicated feeling deep inside of hip was noted.   Pt. indicated it a time or two in front of hip but mostly in back of Lt hip while sleeping.    Pertinent History History of injection Lt hip with improvement noted.  History of Rt THA    Limitations Other (comment);Walking   lying down/sleeping   Diagnostic tests MRI lumbar showing small disc buldge Lt L3/L4, foraminal stenosis    Patient Stated Goals Reduce pain, improve sleep, learn what to do or not to do    Currently in Pain? No/denies    Pain Score 4    nighttime pain   Pain Location Hip    Pain Orientation Left    Pain Descriptors / Indicators Aching    Pain Type Chronic pain    Pain Onset More than a month ago    Pain Frequency Intermittent    Aggravating Factors  nighttime    Pain Relieving Factors roll over and get back to sleep                [facility] PT Assessment - 08/08/21 0001       Assessment   Medical Diagnosis M48.07 (ICD-10-CM) - Spinal stenosis of lumbosacral region    Referring  Provider (PT) Kathryne Hitch, MD    Onset Date/Surgical Date 04/29/21    Hand Dominance Right      PROM   Left Hip External Rotation  50   in supine hip flexion 90 degrees   Left Hip Internal Rotation  38   in supine hip flexion 90 degrees     Flexibility   Soft Tissue Assessment /Muscle Length yes    Quadriceps + Thomas test supine Lt for rectus tightness                           [facility] Adult PT Treatment/Exercise - 08/08/21 0001       Knee/Hip Exercises: Stretches   Other Knee/Hip Stretches supine LTR stretch 15 sec x 3      Knee/Hip Exercises: Aerobic   Nustep Lvl 6 12 mins UE/LE      Knee/Hip Exercises: Supine   Bridges 15 reps;Both    Single Leg Bridge 15 reps;Both      Manual Therapy   Manual therapy comments prone cPA L3-L5 g3, compression to Lt QL              Trigger Point Dry Needling - 08/08/21 0001     Consent Given? Yes    Education Handout Provided Previously provided    Muscles Treated Back/Hip Quadratus lumborum   Lt    Quadratus Lumborum Response Twitch response elicited                     PT Short Term Goals - 07/11/21 0926       PT SHORT TERM GOAL #1   Title Patient will demonstrate independent use of home exercise program to maintain progress from in clinic treatments.    Time 3    Period Weeks    Status Achieved    Target Date 07/24/21               PT Long Term Goals - 08/08/21 1001       PT LONG TERM GOAL #1   Title Patient will demonstrate/report pain at worst less than or equal to 2/10 to facilitate minimal limitation in daily activity secondary to pain symptoms.    Time 10    Period Weeks    Status On-going    Target Date 09/11/21      PT LONG TERM GOAL #2   Title Patient will demonstrate independent use of home exercise program to facilitate ability to maintain/progress functional gains from skilled physical therapy services.    Time 10    Period Weeks    Status Achieved    Target Date 09/11/21      PT LONG TERM GOAL #3   Title Pt. will demonstrate FOTO outcome > or = 71 % to indicated reduced disability due to condition.    Time 10    Period Weeks    Status On-going    Target Date 09/11/21      PT LONG TERM GOAL #4   Title Pt. will demonstrate Lt hip MMT 5/5 throughout to facilitate usual stairs, walking and exercise at PLOF.    Time 10    Period Weeks    Status On-going    Target Date 09/11/21      PT LONG TERM GOAL #5   Title Pt. will demonstrate reciprocal gait pattern up/down stairs s UE assist without symptom for household navigation.    Time 10    Period  Weeks    Status Achieved    Target Date 09/11/21               08/08/21 1000  Plan  Clinical Impression Statement Lumbar spine related screening in manual c passive mobility assessment didn't seem to to connect to complaints in nighttime pain.  Pt. to benefit from continued improvement in hip strength and lumbar/hip mobility.  Personal Factors and Comorbidities Other (history of  Rt TKA, lumbar laser surgery in past)  Examination-Activity Limitations Sleep;Stairs;Stand;Locomotion Level  Examination-Participation Restrictions Community Activity;Shop;Other (exercise)  Pt will benefit from skilled therapeutic intervention in order to improve on the following deficits Abnormal gait;Decreased endurance;Hypomobility;Decreased activity tolerance;Decreased strength;Pain;Difficulty walking;Increased fascial restricitons;Decreased mobility;Decreased balance;Decreased range of motion;Impaired perceived functional ability;Improper body mechanics;Impaired flexibility;Decreased coordination  Stability/Clinical Decision Making Stable/Uncomplicated  Rehab Potential Good  PT Frequency Other (comment) (1-2x/week)  PT Duration Other (comment) (10 weeks)  PT Treatment/Interventions ADLs/Self Care Home Management;Cryotherapy;Electrical Stimulation;Iontophoresis 4mg /ml Dexamethasone;Moist Heat;Balance training;Therapeutic exercise;Therapeutic activities;Functional mobility training;Stair training;Gait training;DME Instruction;Ultrasound;Neuromuscular re-education;Patient/family education;Passive range of motion;Spinal Manipulations;Joint Manipulations;Dry needling;Taping;Manual techniques  PT Next Visit Plan DN prn. Continued hip strengthening, recheck for possible trial HEP.  PT Home Exercise Plan  Consulted and Agree with Plan of Care Patient          Patient will benefit from skilled therapeutic intervention in order to improve the following deficits and impairments:     Visit Diagnosis: Chronic bilateral low back pain with left-sided sciatica  Pain in left hip  Muscle weakness (generalized)  Difficulty in walking, not elsewhere classified     Problem List Patient Active Problem List   Diagnosis Date Noted   Degenerative arthritis of right hip 06/02/2013   Status post THR (total hip replacement) 06/02/2013    14/10/2012, PT, DPT, OCS, ATC 08/08/21   10:17 AM    [facility] Physical Therapy [address] [place], [place], [place] Phone: [phone]   Fax:  [phone]  Name: Beth Mcdowell MRN: Otho Darner Date of Birth: 02-Jan-1951

## 2021-08-11 DIAGNOSIS — Z1231 Encounter for screening mammogram for malignant neoplasm of breast: Secondary | ICD-10-CM | POA: Diagnosis not present

## 2021-08-14 DIAGNOSIS — C44712 Basal cell carcinoma of skin of right lower limb, including hip: Secondary | ICD-10-CM | POA: Diagnosis not present

## 2021-08-21 ENCOUNTER — Other Ambulatory Visit: Payer: Self-pay

## 2021-08-21 ENCOUNTER — Encounter: Payer: Self-pay | Admitting: Rehabilitative and Restorative Service Providers"

## 2021-08-21 ENCOUNTER — Ambulatory Visit: Payer: Medicare PPO | Admitting: Rehabilitative and Restorative Service Providers"

## 2021-08-21 DIAGNOSIS — G8929 Other chronic pain: Secondary | ICD-10-CM | POA: Diagnosis not present

## 2021-08-21 DIAGNOSIS — M6281 Muscle weakness (generalized): Secondary | ICD-10-CM | POA: Diagnosis not present

## 2021-08-21 DIAGNOSIS — M25552 Pain in left hip: Secondary | ICD-10-CM

## 2021-08-21 DIAGNOSIS — R262 Difficulty in walking, not elsewhere classified: Secondary | ICD-10-CM | POA: Diagnosis not present

## 2021-08-21 DIAGNOSIS — M5442 Lumbago with sciatica, left side: Secondary | ICD-10-CM | POA: Diagnosis not present

## 2021-08-21 NOTE — Patient Instructions (Signed)
Access Code: [email or web address] URL: [email or web address] Date: 08/21/2021 Prepared by: Chyrel Masson  Exercises Supine Bridge - 2 x daily - 7 x weekly - 3 sets - 10 reps - 2 hold Sidelying Hip Abduction - 2 x daily - 7 x weekly - 3 sets - 10 reps Supine Piriformis Stretch with Foot on Ground (Mirrored) - 2 x daily - 7 x weekly - 1 sets - 5 reps - 30 hold Figure 4 Bridge - 2 x daily - 7 x weekly - 1-2 sets - 10 reps Supine Lower Trunk Rotation - 2 x daily - 7 x weekly - 1 sets - 5 reps - 15 hold Standing Lumbar Extension with Counter - 2-3 x daily - 7 x weekly - 1 sets - 5-10 reps

## 2021-08-21 NOTE — Therapy (Addendum)
[facility] [address] [place], [place], [address] Phone: [phone]   Fax:  [phone]  Physical Therapy Treatment /Discharge   Patient Details  Name: Beth Mcdowell MRN: [id number] Date of Birth: Feb 14, 1951 Referring Provider (PT): Kathryne Hitch, MD   Encounter Date: 08/21/2021   PT End of Session - 08/21/21 0930     Visit Number 5    Number of Visits 20    Date for PT Re-Evaluation 09/11/21    Authorization Type [employer] $[id number]    Authorization Time Period 09/05/2021    Progress Note Due on Visit 10    PT Start Time 0930    PT Stop Time 1000    PT Time Calculation (min) 30 min    Activity Tolerance Patient tolerated treatment well    Behavior During Therapy WFL for tasks assessed/performed             Past Medical History:  Diagnosis Date   Anemia    SOMETIMES   Arthritis    hips   Muscle spasm    SPASMS BOTH LEGS AND FEET -FROM LUMBAR STENOSIS   Spinal stenosis    Hx: of   Thyroid disease 1980   nodule- treated w/ synthroid for a couple of years    Past Surgical History:  Procedure Laterality Date   BACK SURGERY     Hx: of lumbar   BREAST BIOPSY     Hx: of left breast 2/ 2014   BREAST BIOPSY Left 02/20/2013   Procedure: Left breast wire guided excision;  Surgeon: Emelia Loron, MD;  Location: [facility] OR;  Service: General;  Laterality: Left;   COLONOSCOPY     Hx: of   DILATION AND CURETTAGE OF UTERUS     OVARIAN CYST REMOVAL  1977, 1980   TOTAL HIP ARTHROPLASTY Right 06/02/2013   Procedure: RIGHT TOTAL HIP ARTHROPLASTY ANTERIOR APPROACH;  Surgeon: Kathryne Hitch, MD;  Location: [facility];  Service: Orthopedics;  Laterality: Right;   X-STOP IMPLANTATION  02/2012    There were no vitals filed for this visit.   Subjective Assessment - 08/21/21 0932     Subjective Pt. indicated she had one episode of of numbness in foot.  Pt. indicated having similar complaints at night as chief complaint.  Noted in Rt  sidelying.    Pertinent History History of injection Lt hip with improvement noted.  History of Rt THA    Limitations Other (comment);Walking   lying down/sleeping   Diagnostic tests MRI lumbar showing small disc buldge Lt L3/L4, foraminal stenosis    Patient Stated Goals Reduce pain, improve sleep, learn what to do or not to do    Currently in Pain? No/denies   sore and fatigue from workout   Pain Onset More than a month ago                [facility] PT Assessment - 08/21/21 0001       Assessment   Medical Diagnosis M48.07 (ICD-10-CM) - Spinal stenosis of lumbosacral region    Referring Provider (PT) Kathryne Hitch, MD    Onset Date/Surgical Date 04/29/21      PROM   Left Hip External Rotation  48   in supine hip flexion 90 degrees   Left Hip Internal Rotation  37   in supine hip flexion 90 degrees     Strength   Right Hip Flexion 5/5    Right Hip Extension 5/5    Right Hip ABduction 5/5  Left Hip Flexion 5/5    Left Hip Extension 5/5    Left Hip ABduction 5/5                           [facility] Adult PT Treatment/Exercise - 08/21/21 0001       Exercises   Other Exercises  HEP review c verbal cues and handout review for trial HEP upcoming.  Additional time spent in review to ensure techniques and knowledge.      Manual Therapy   Manual therapy comments percussive device STM to Lt glute med/max                     PT Education - 08/21/21 1002     Education Details HEP review    Person(s) Educated Patient    Methods Explanation;Demonstration;Verbal cues;Handout    Comprehension Verbalized understanding;Returned demonstration              PT Short Term Goals - 07/11/21 0926       PT SHORT TERM GOAL #1   Title Patient will demonstrate independent use of home exercise program to maintain progress from in clinic treatments.    Time 3    Period Weeks    Status Achieved    Target Date 07/24/21               PT Long  Term Goals - 08/21/21 1003       PT LONG TERM GOAL #1   Title Patient will demonstrate/report pain at worst less than or equal to 2/10 to facilitate minimal limitation in daily activity secondary to pain symptoms.    Time 10    Period Weeks    Status On-going    Target Date 09/11/21      PT LONG TERM GOAL #2   Title Patient will demonstrate independent use of home exercise program to facilitate ability to maintain/progress functional gains from skilled physical therapy services.    Time 10    Period Weeks    Status Achieved    Target Date 09/11/21      PT LONG TERM GOAL #3   Title Pt. will demonstrate FOTO outcome > or = 71 % to indicated reduced disability due to condition.    Time 10    Period Weeks    Status Achieved    Target Date 09/11/21      PT LONG TERM GOAL #4   Title Pt. will demonstrate Lt hip MMT 5/5 throughout to facilitate usual stairs, walking and exercise at PLOF.    Time 10    Period Weeks    Status Achieved    Target Date 09/11/21      PT LONG TERM GOAL #5   Title Pt. will demonstrate reciprocal gait pattern up/down stairs s UE assist without symptom for household navigation.    Time 10    Period Weeks    Status Achieved    Target Date 09/11/21                   Plan - 08/21/21 1003     Clinical Impression Statement Pt. has demonstrated improvement in functional mobility symptoms and daily pain and has demonstrated improvement in strength and mobility compared to evaluation.  Continued low mild symptoms at night present in posterior Lt hip region at this time.  In agreement with Pt. wishes, plan at this time to transition to trial time c HEP c  return prn for therapy services.    Personal Factors and Comorbidities Other   history of Rt TKA, lumbar laser surgery in past   Examination-Activity Limitations Sleep;Stairs;Stand;Locomotion Level    Examination-Participation Restrictions Community Activity;Shop;Other   exercise   Stability/Clinical  Decision Making Stable/Uncomplicated    Rehab Potential Good    PT Frequency Other (comment)   1-2x/week   PT Duration Other (comment)   10 weeks   PT Treatment/Interventions ADLs/Self Care Home Management;Cryotherapy;Electrical Stimulation;Iontophoresis 4mg /ml Dexamethasone;Moist Heat;Balance training;Therapeutic exercise;Therapeutic activities;Functional mobility training;Stair training;Gait training;DME Instruction;Ultrasound;Neuromuscular re-education;Patient/family education;Passive range of motion;Spinal Manipulations;Joint Manipulations;Dry needling;Taping;Manual techniques    PT Next Visit Plan Trial HEP.   Check humana cert dates if returning.    PT Home Exercise Plan [id number]    Consulted and Agree with Plan of Care Patient             Patient will benefit from skilled therapeutic intervention in order to improve the following deficits and impairments:  Abnormal gait, Decreased endurance, Hypomobility, Decreased activity tolerance, Decreased strength, Pain, Difficulty walking, Increased fascial restricitons, Decreased mobility, Decreased balance, Decreased range of motion, Impaired perceived functional ability, Improper body mechanics, Impaired flexibility, Decreased coordination  Visit Diagnosis: Chronic bilateral low back pain with left-sided sciatica  Pain in left hip  Muscle weakness (generalized)  Difficulty in walking, not elsewhere classified     Problem List Patient Active Problem List   Diagnosis Date Noted   Degenerative arthritis of right hip 06/02/2013   Status post THR (total hip replacement) 06/02/2013    Chyrel Masson, PT, DPT, OCS, ATC 08/21/21  10:05 AM  PHYSICAL THERAPY DISCHARGE SUMMARY  Visits from Start of Care: 5  Current functional level related to goals / functional outcomes: See note   Remaining deficits: See note   Education / Equipment: HEP   Patient goals were met. Patient is being discharged due to being pleased with the  current functional level.  Chyrel Masson, PT, DPT, OCS, ATC 09/24/21  8:42 AM      [facility] [address] [place], [place], [address] Phone: [phone]   Fax:  [phone]  Name: Beth Mcdowell MRN: [id number] Date of Birth: 1950-07-22

## 2021-09-17 DIAGNOSIS — N1831 Chronic kidney disease, stage 3a: Secondary | ICD-10-CM | POA: Diagnosis not present

## 2021-09-17 DIAGNOSIS — M859 Disorder of bone density and structure, unspecified: Secondary | ICD-10-CM | POA: Diagnosis not present

## 2021-09-17 DIAGNOSIS — E041 Nontoxic single thyroid nodule: Secondary | ICD-10-CM | POA: Diagnosis not present

## 2021-09-17 DIAGNOSIS — E785 Hyperlipidemia, unspecified: Secondary | ICD-10-CM | POA: Diagnosis not present

## 2021-09-24 DIAGNOSIS — H3530 Unspecified macular degeneration: Secondary | ICD-10-CM | POA: Diagnosis not present

## 2021-09-24 DIAGNOSIS — B009 Herpesviral infection, unspecified: Secondary | ICD-10-CM | POA: Diagnosis not present

## 2021-09-24 DIAGNOSIS — Z0000 Encounter for general adult medical examination without abnormal findings: Secondary | ICD-10-CM | POA: Diagnosis not present

## 2021-09-24 DIAGNOSIS — E785 Hyperlipidemia, unspecified: Secondary | ICD-10-CM | POA: Diagnosis not present

## 2021-09-24 DIAGNOSIS — R42 Dizziness and giddiness: Secondary | ICD-10-CM | POA: Diagnosis not present

## 2021-09-24 DIAGNOSIS — M8580 Other specified disorders of bone density and structure, unspecified site: Secondary | ICD-10-CM | POA: Diagnosis not present

## 2021-09-24 DIAGNOSIS — Z1339 Encounter for screening examination for other mental health and behavioral disorders: Secondary | ICD-10-CM | POA: Diagnosis not present

## 2021-09-24 DIAGNOSIS — R82998 Other abnormal findings in urine: Secondary | ICD-10-CM | POA: Diagnosis not present

## 2021-09-24 DIAGNOSIS — Z1331 Encounter for screening for depression: Secondary | ICD-10-CM | POA: Diagnosis not present

## 2021-09-24 DIAGNOSIS — I6521 Occlusion and stenosis of right carotid artery: Secondary | ICD-10-CM | POA: Diagnosis not present

## 2021-09-24 DIAGNOSIS — N1831 Chronic kidney disease, stage 3a: Secondary | ICD-10-CM | POA: Diagnosis not present

## 2021-09-26 ENCOUNTER — Other Ambulatory Visit: Payer: Self-pay | Admitting: Internal Medicine

## 2021-09-26 DIAGNOSIS — I6521 Occlusion and stenosis of right carotid artery: Secondary | ICD-10-CM

## 2021-10-07 ENCOUNTER — Ambulatory Visit
Admission: RE | Admit: 2021-10-07 | Discharge: 2021-10-07 | Disposition: A | Discharge status: Home / Self Care | Payer: Medicare PPO | Source: Ambulatory Visit | Attending: Internal Medicine | Admitting: Internal Medicine

## 2021-10-07 DIAGNOSIS — E785 Hyperlipidemia, unspecified: Secondary | ICD-10-CM | POA: Diagnosis not present

## 2021-10-07 DIAGNOSIS — I6521 Occlusion and stenosis of right carotid artery: Secondary | ICD-10-CM | POA: Diagnosis not present

## 2021-11-11 ENCOUNTER — Ambulatory Visit: Payer: Medicare PPO | Admitting: Vascular Surgery

## 2021-11-11 ENCOUNTER — Encounter: Payer: Self-pay | Admitting: Vascular Surgery

## 2021-11-11 DIAGNOSIS — I6521 Occlusion and stenosis of right carotid artery: Secondary | ICD-10-CM | POA: Diagnosis not present

## 2021-11-11 DIAGNOSIS — I779 Disorder of arteries and arterioles, unspecified: Secondary | ICD-10-CM | POA: Insufficient documentation

## 2021-11-11 NOTE — Progress Notes (Signed)
? ? ?Patient name: Beth Mcdowell MRN: [id number] DOB: 01/10/1951 Sex: female ? ?REASON FOR CONSULT: Evaluate 50-69% right ICA stenosis ? ?HPI: ?Beth Mcdowell is a 71 y.o. female, with history of hyperlipidemia that presents for evaluation of moderate right ICA stenosis.  Patient's carotid disease has been followed by her primary care doctor Dr. Clelia Croft.  She denies any history of strokes or TIAs.  She has had no recent unilateral vision loss or acute weakness on one side of her body.  She does describe blurry vision in both eyes and has a known history of macular degeneration.  Recently her carotid ultrasound showed progression of the stenosis to 50 to 69% in the right ICA.  She denies any associated neck or head surgery.  She is on aspirin.  She had trouble with statins and is meeting with someone in Dr. Alver Fisher office today to figure out a plan given a foggy feeling when she take statin medications. ? ?Past Medical History:  ?Diagnosis Date  ? Anemia   ? SOMETIMES  ? Arthritis   ? hips  ? Muscle spasm   ? SPASMS BOTH LEGS AND FEET -FROM LUMBAR STENOSIS  ? Spinal stenosis   ? Hx: of  ? Thyroid disease 1980  ? nodule- treated w/ synthroid for a couple of years  ? ? ?Past Surgical History:  ?Procedure Laterality Date  ? BACK SURGERY    ? Hx: of lumbar  ? BREAST BIOPSY    ? Hx: of left breast 2/ 2014  ? BREAST BIOPSY Left 02/20/2013  ? Procedure: Left breast wire guided excision;  Surgeon: Emelia Loron, MD;  Location: [facility] OR;  Service: General;  Laterality: Left;  ? COLONOSCOPY    ? Hx: of  ? DILATION AND CURETTAGE OF UTERUS    ? OVARIAN CYST REMOVAL  1977, 1980  ? TOTAL HIP ARTHROPLASTY Right 06/02/2013  ? Procedure: RIGHT TOTAL HIP ARTHROPLASTY ANTERIOR APPROACH;  Surgeon: Kathryne Hitch, MD;  Location: [facility];  Service: Orthopedics;  Laterality: Right;  ? X-STOP IMPLANTATION  02/2012  ? ? ?Family History  ?Problem Relation Age of Onset  ? Heart disease Mother   ? Heart disease Father   ? Cancer Brother   ?  Hypertension Other   ? ? ?SOCIAL HISTORY: ?Social History  ? ?Socioeconomic History  ? Marital status: Married  ?  Spouse name: Not on file  ? Number of children: Not on file  ? Years of education: Not on file  ? Highest education level: Not on file  ?Occupational History  ? Not on file  ?Tobacco Use  ? Smoking status: Never  ? Smokeless tobacco: Never  ?Substance and Sexual Activity  ? Alcohol use: Yes  ?  Comment: 3 glasses/week  ? Drug use: No  ? Sexual activity: Not on file  ?Other Topics Concern  ? Not on file  ?Social History Narrative  ? ** Merged History Encounter **  ?    ? ?Social Determinants of Health  ? ?Financial Resource Strain: Not on file  ?Food Insecurity: Not on file  ?Transportation Needs: Not on file  ?Physical Activity: Not on file  ?Stress: Not on file  ?Social Connections: Not on file  ?Intimate Partner Violence: Not on file  ? ? ?Allergies  ?Allergen Reactions  ? Erythromycin   ?  GI upset... Pt stays away from all mycins  ? ? ?Current Outpatient Medications  ?Medication Sig Dispense Refill  ? Multiple Vitamins-Minerals (PRESERVISION AREDS) CAPS SMARTSIG:1  By Mouth    ? Ibuprofen-Diphenhydramine Cit (ADVIL PM PO) Take 1 tablet by mouth every 6 (six) hours as needed (sleep).     ? loperamide (IMODIUM A-D) 2 MG tablet 2 now and one hourly prn diarrhea.  Max 8 tabs in 24 hours 30 tablet 0  ? ondansetron (ZOFRAN) 8 MG tablet Take 1 tablet (8 mg total) by mouth every 8 (eight) hours as needed for nausea or vomiting. 20 tablet 0  ? ?No current facility-administered medications for this visit.  ? ? ?REVIEW OF SYSTEMS:  ?[X]  denotes positive finding, [ ]  denotes negative finding ?Cardiac  Comments:  ?Chest pain or chest pressure:    ?Shortness of breath upon exertion:    ?Short of breath when lying flat:    ?Irregular heart rhythm:    ?    ?Vascular    ?Pain in calf, thigh, or hip brought on by ambulation:    ?Pain in feet at night that wakes you up from your sleep:     ?Blood clot in your veins:     ?Leg swelling:     ?    ?Pulmonary    ?Oxygen at home:    ?Productive cough:     ?Wheezing:     ?    ?Neurologic    ?Sudden weakness in arms or legs:     ?Sudden numbness in arms or legs:     ?Sudden onset of difficulty speaking or slurred speech:    ?Temporary loss of vision in one eye:     ?Problems with dizziness:     ?    ?Gastrointestinal    ?Blood in stool:     ?Vomited blood:     ?    ?Genitourinary    ?Burning when urinating:     ?Blood in urine:    ?    ?Psychiatric    ?Major depression:     ?    ?Hematologic    ?Bleeding problems:    ?Problems with blood clotting too easily:    ?    ?Skin    ?Rashes or ulcers:    ?    ?Constitutional    ?Fever or chills:    ? ? ?PHYSICAL EXAM: ?Vitals:  ? 11/11/21 0947 11/11/21 0951  ?BP: 106/71 113/74  ?Pulse: 76 76  ?Resp: 14   ?Temp: 97.6 ?F (36.4 ?C)   ?TempSrc: Temporal   ?SpO2: 98%   ?Weight: 146 lb (66.2 kg)   ?Height: 5\' 7"  (1.702 m)   ? ? ?GENERAL: The patient is a well-nourished female, in no acute distress. The vital signs are documented above. ?CARDIAC: There is a regular rate and rhythm.  ?VASCULAR:  ?No previous neck incisions ?Palpable PT pulses bilaterally ?PULMONARY: No respiratory distress. ?ABDOMEN: Soft and non-tender. ?MUSCULOSKELETAL: There are no major deformities or cyanosis. ?NEUROLOGIC: No focal weakness or paresthesias are detected.  CN II-XII grossly intact. ?SKIN: There are no ulcers or rashes noted. ?PSYCHIATRIC: The patient has a normal affect. ? ?DATA:  ? ?Carotid ultrasound report from [facility] imaging shows a noncalcified atheromatous plaque in the right ICA origin with elevated end-diastolic velocity consistent with a 50 to 69% stenosis. ? ?Assessment/Plan: ? ?71 year old female presents for evaluation of asymptomatic 50 to 69% right ICA stenosis.  Discussed that in the setting of asymptomatic carotid disease we reserve surgical intervention for greater than 80% stenosis for stroke risk reduction.  With her degree of narrowing in the  50 to 69% range by velocity criteria  there is no indication for surgical intervention.  Discussed I would see her again in 6 months with carotid duplex here in the office for continued surveillance.  Discussed aspirin and statin for risk reduction.  She is having some trouble with statin tolerance and is going to work that out with Dr. Alver FisherShaw's office and has an appointment this afternoon.  Discussed she call with questions or concerns. ? ? ?Cephus Shellinghristopher J. Tunisha Ruland, MD ?[facility] ?Office: [phone] ? ? ? ? ?

## 2021-11-12 DIAGNOSIS — N1831 Chronic kidney disease, stage 3a: Secondary | ICD-10-CM | POA: Diagnosis not present

## 2021-11-12 DIAGNOSIS — I6521 Occlusion and stenosis of right carotid artery: Secondary | ICD-10-CM | POA: Diagnosis not present

## 2021-11-12 DIAGNOSIS — E785 Hyperlipidemia, unspecified: Secondary | ICD-10-CM | POA: Diagnosis not present

## 2021-11-19 ENCOUNTER — Other Ambulatory Visit: Payer: Self-pay | Admitting: *Deleted

## 2021-11-19 DIAGNOSIS — I6521 Occlusion and stenosis of right carotid artery: Secondary | ICD-10-CM

## 2022-01-02 DIAGNOSIS — L600 Ingrowing nail: Secondary | ICD-10-CM | POA: Diagnosis not present

## 2022-01-02 DIAGNOSIS — M792 Neuralgia and neuritis, unspecified: Secondary | ICD-10-CM | POA: Diagnosis not present

## 2022-01-14 DIAGNOSIS — L600 Ingrowing nail: Secondary | ICD-10-CM | POA: Diagnosis not present

## 2022-01-14 DIAGNOSIS — M792 Neuralgia and neuritis, unspecified: Secondary | ICD-10-CM | POA: Diagnosis not present

## 2022-01-28 DIAGNOSIS — M792 Neuralgia and neuritis, unspecified: Secondary | ICD-10-CM | POA: Diagnosis not present

## 2022-01-28 DIAGNOSIS — L600 Ingrowing nail: Secondary | ICD-10-CM | POA: Diagnosis not present

## 2022-02-25 DIAGNOSIS — M792 Neuralgia and neuritis, unspecified: Secondary | ICD-10-CM | POA: Diagnosis not present

## 2022-02-25 DIAGNOSIS — L600 Ingrowing nail: Secondary | ICD-10-CM | POA: Diagnosis not present

## 2022-03-19 ENCOUNTER — Telehealth: Payer: Self-pay | Admitting: Orthopaedic Surgery

## 2022-03-19 NOTE — Telephone Encounter (Signed)
Received vm from patient wanting copy of the MRI. IC,lmvm. Advised will need to get MRI from [facility] where she had it done. Advised if all she needs is the report to call me back and I left my direct number.

## 2022-03-30 DIAGNOSIS — H5203 Hypermetropia, bilateral: Secondary | ICD-10-CM | POA: Diagnosis not present

## 2022-03-30 DIAGNOSIS — H2513 Age-related nuclear cataract, bilateral: Secondary | ICD-10-CM | POA: Diagnosis not present

## 2022-03-30 DIAGNOSIS — H353132 Nonexudative age-related macular degeneration, bilateral, intermediate dry stage: Secondary | ICD-10-CM | POA: Diagnosis not present

## 2022-04-23 DIAGNOSIS — M5116 Intervertebral disc disorders with radiculopathy, lumbar region: Secondary | ICD-10-CM | POA: Diagnosis not present

## 2022-06-01 DIAGNOSIS — M5116 Intervertebral disc disorders with radiculopathy, lumbar region: Secondary | ICD-10-CM | POA: Diagnosis not present

## 2022-06-17 DIAGNOSIS — M5116 Intervertebral disc disorders with radiculopathy, lumbar region: Secondary | ICD-10-CM | POA: Diagnosis not present

## 2022-08-18 DIAGNOSIS — Z85828 Personal history of other malignant neoplasm of skin: Secondary | ICD-10-CM | POA: Diagnosis not present

## 2022-08-18 DIAGNOSIS — L821 Other seborrheic keratosis: Secondary | ICD-10-CM | POA: Diagnosis not present

## 2022-08-18 DIAGNOSIS — D225 Melanocytic nevi of trunk: Secondary | ICD-10-CM | POA: Diagnosis not present

## 2022-08-18 DIAGNOSIS — D2261 Melanocytic nevi of right upper limb, including shoulder: Secondary | ICD-10-CM | POA: Diagnosis not present

## 2022-08-18 DIAGNOSIS — L812 Freckles: Secondary | ICD-10-CM | POA: Diagnosis not present

## 2022-08-18 DIAGNOSIS — L308 Other specified dermatitis: Secondary | ICD-10-CM | POA: Diagnosis not present

## 2022-08-18 DIAGNOSIS — D2262 Melanocytic nevi of left upper limb, including shoulder: Secondary | ICD-10-CM | POA: Diagnosis not present

## 2022-08-24 DIAGNOSIS — Z1231 Encounter for screening mammogram for malignant neoplasm of breast: Secondary | ICD-10-CM | POA: Diagnosis not present

## 2022-09-01 ENCOUNTER — Ambulatory Visit (INDEPENDENT_AMBULATORY_CARE_PROVIDER_SITE_OTHER): Payer: Medicare PPO | Admitting: Vascular Surgery

## 2022-09-01 ENCOUNTER — Encounter: Payer: Self-pay | Admitting: Vascular Surgery

## 2022-09-01 ENCOUNTER — Ambulatory Visit (HOSPITAL_COMMUNITY)
Admission: RE | Admit: 2022-09-01 | Discharge: 2022-09-01 | Disposition: A | Discharge status: Home / Self Care | Payer: Medicare PPO | Source: Ambulatory Visit | Attending: Vascular Surgery | Admitting: Vascular Surgery

## 2022-09-01 VITALS — BP 127/77 | HR 76 | Temp 97.6°F | Resp 14 | Ht 67.0 in | Wt 150.0 lb

## 2022-09-01 DIAGNOSIS — I6521 Occlusion and stenosis of right carotid artery: Secondary | ICD-10-CM

## 2022-09-01 NOTE — Progress Notes (Signed)
Virtual Visit via Telephone Note   I connected with Beth Mcdowell on 09/01/2022 using the [employer] by telephone and verified that I was speaking with the correct person using two identifiers. Patient was located in her car traveling. I am located at [facility] office.   The limitations of evaluation and management by telemedicine and the availability of in person appointments have been previously discussed with the patient and are documented in the patients chart. The patient expressed understanding and consented to proceed.  PCP: Ginger Organ., MD   Chief Complaint: 6 month follow-up carotid surveillance  History of Present Illness: Beth Mcdowell is a 72 y.o. female that presents for 87-monthfollow-up for carotid surveillance.  She was previously seen for an asymptomatic 50 to 69% right ICA stenosis.  [place] [facility] [place] origin stenosis of 50 to 69%.  She reports no new neurologic events since last evaluation.  She does have some blurry vision from her macular degeneration.  She cannot tolerate statins and is on Repatha.  She does take an aspirin daily.  Her carotid ultrasound today shows a 40 to 59% right ICA stenosis 1 to 39% left ICA stenosis..  Past Medical History:  Diagnosis Date   Anemia    SOMETIMES   Arthritis    hips   Muscle spasm    SPASMS BOTH LEGS AND FEET -FROM LUMBAR STENOSIS   Spinal stenosis    Hx: of   Thyroid disease 1980   nodule- treated w/ synthroid for a couple of years    Past Surgical History:  Procedure Laterality Date   BACK SURGERY     Hx: of lumbar   BREAST BIOPSY     Hx: of left breast 2/ 2014   BREAST BIOPSY Left 02/20/2013   Procedure: Left breast wire guided excision;  Surgeon: MRolm Bookbinder MD;  [facility]  Service: General;  Laterality: Left;   COLONOSCOPY     Hx: of   DILATION AND CURETTAGE OF UTERUS     OVARIAN CYST RLudlowRight  06/02/2013   Procedure: RIGHT TOTAL HIP ARTHROPLASTY ANTERIOR APPROACH;  Surgeon: CMcarthur Rossetti MD;  Location: [facility];  Service: Orthopedics;  Laterality: Right;   X-STOP IMPLANTATION  02/2012    Current Meds  Medication Sig   Multiple Vitamins-Minerals (PRESERVISION AREDS) CAPS SMARTSIG:1 By Mouth   REPATHA SURECLICK [id number]/ML SOAJ Inject into the skin.    12 system ROS was negative unless otherwise noted in HPI  Observations/Objective:  Carotid ultrasound today shows a 40 to 59% right ICA stenosis and 1 to 39% left ICA stenosis.  Assessment and Plan:  72year old female presents for 675-monthollow-up for surveillance of her carotid artery disease.  Previously seen with an [place] 50 to 69% [place]  Repeat ultrasound today shows 40 to 59% right ICA stenosis with 1 to 39% left ICA stenosis.  I discussed no progression of carotid disease.  Discussed no need for surgical intervention.  Discussed we recommend surgical therapy for greater than 80% stenosis for asymptomatic disease.  She has statin intolerance and will remain on Repatha and also aspirin.  Follow Up Instructions:   Follow up: 1 year with repeat carotid [place] I discussed the assessment and treatment plan with the patient. The patient was provided an opportunity to ask questions and all were answered.  The patient agreed with the plan and demonstrated an understanding of the instructions.   The patient was advised to call back or seek an in-person evaluation if the symptoms worsen or if the condition fails to improve as anticipated.  I spent 5 minutes with the patient via telephone encounter.   Signed, Marty Heck [facility] Office: [phone]  09/01/2022, 4:16 PM

## 2022-09-29 DIAGNOSIS — E041 Nontoxic single thyroid nodule: Secondary | ICD-10-CM | POA: Diagnosis not present

## 2022-09-29 DIAGNOSIS — D649 Anemia, unspecified: Secondary | ICD-10-CM | POA: Diagnosis not present

## 2022-09-29 DIAGNOSIS — N1831 Chronic kidney disease, stage 3a: Secondary | ICD-10-CM | POA: Diagnosis not present

## 2022-09-29 DIAGNOSIS — E785 Hyperlipidemia, unspecified: Secondary | ICD-10-CM | POA: Diagnosis not present

## 2022-09-29 DIAGNOSIS — R7989 Other specified abnormal findings of blood chemistry: Secondary | ICD-10-CM | POA: Diagnosis not present

## 2022-10-06 DIAGNOSIS — N1831 Chronic kidney disease, stage 3a: Secondary | ICD-10-CM | POA: Diagnosis not present

## 2022-10-06 DIAGNOSIS — D649 Anemia, unspecified: Secondary | ICD-10-CM | POA: Diagnosis not present

## 2022-10-06 DIAGNOSIS — E785 Hyperlipidemia, unspecified: Secondary | ICD-10-CM | POA: Diagnosis not present

## 2022-10-06 DIAGNOSIS — Z23 Encounter for immunization: Secondary | ICD-10-CM | POA: Diagnosis not present

## 2022-10-06 DIAGNOSIS — I6521 Occlusion and stenosis of right carotid artery: Secondary | ICD-10-CM | POA: Diagnosis not present

## 2022-10-06 DIAGNOSIS — Z1339 Encounter for screening examination for other mental health and behavioral disorders: Secondary | ICD-10-CM | POA: Diagnosis not present

## 2022-10-06 DIAGNOSIS — Z1331 Encounter for screening for depression: Secondary | ICD-10-CM | POA: Diagnosis not present

## 2022-10-06 DIAGNOSIS — Z0000 Encounter for general adult medical examination without abnormal findings: Secondary | ICD-10-CM | POA: Diagnosis not present

## 2022-10-29 DIAGNOSIS — M8589 Other specified disorders of bone density and structure, multiple sites: Secondary | ICD-10-CM | POA: Diagnosis not present

## 2022-11-17 DIAGNOSIS — D649 Anemia, unspecified: Secondary | ICD-10-CM | POA: Diagnosis not present

## 2022-11-17 DIAGNOSIS — K219 Gastro-esophageal reflux disease without esophagitis: Secondary | ICD-10-CM | POA: Diagnosis not present

## 2022-11-17 DIAGNOSIS — E785 Hyperlipidemia, unspecified: Secondary | ICD-10-CM | POA: Diagnosis not present

## 2022-11-17 DIAGNOSIS — I6521 Occlusion and stenosis of right carotid artery: Secondary | ICD-10-CM | POA: Diagnosis not present

## 2022-11-17 DIAGNOSIS — R5383 Other fatigue: Secondary | ICD-10-CM | POA: Diagnosis not present

## 2022-12-25 DIAGNOSIS — H353132 Nonexudative age-related macular degeneration, bilateral, intermediate dry stage: Secondary | ICD-10-CM | POA: Diagnosis not present

## 2022-12-25 DIAGNOSIS — H5310 Unspecified subjective visual disturbances: Secondary | ICD-10-CM | POA: Diagnosis not present

## 2022-12-25 DIAGNOSIS — H40021 Open angle with borderline findings, high risk, right eye: Secondary | ICD-10-CM | POA: Diagnosis not present

## 2022-12-25 DIAGNOSIS — H349 Unspecified retinal vascular occlusion: Secondary | ICD-10-CM | POA: Diagnosis not present

## 2023-01-29 DIAGNOSIS — H401121 Primary open-angle glaucoma, left eye, mild stage: Secondary | ICD-10-CM | POA: Diagnosis not present

## 2023-01-29 DIAGNOSIS — H401112 Primary open-angle glaucoma, right eye, moderate stage: Secondary | ICD-10-CM | POA: Diagnosis not present

## 2023-03-03 DIAGNOSIS — H401121 Primary open-angle glaucoma, left eye, mild stage: Secondary | ICD-10-CM | POA: Diagnosis not present

## 2023-03-03 DIAGNOSIS — H401112 Primary open-angle glaucoma, right eye, moderate stage: Secondary | ICD-10-CM | POA: Diagnosis not present

## 2023-03-03 DIAGNOSIS — H2513 Age-related nuclear cataract, bilateral: Secondary | ICD-10-CM | POA: Diagnosis not present

## 2023-04-14 DIAGNOSIS — H903 Sensorineural hearing loss, bilateral: Secondary | ICD-10-CM | POA: Diagnosis not present

## 2023-04-27 DIAGNOSIS — M5459 Other low back pain: Secondary | ICD-10-CM | POA: Diagnosis not present

## 2023-04-27 DIAGNOSIS — R2681 Unsteadiness on feet: Secondary | ICD-10-CM | POA: Diagnosis not present

## 2023-04-27 DIAGNOSIS — M6281 Muscle weakness (generalized): Secondary | ICD-10-CM | POA: Diagnosis not present

## 2023-04-27 DIAGNOSIS — M79672 Pain in left foot: Secondary | ICD-10-CM | POA: Diagnosis not present

## 2023-05-03 DIAGNOSIS — M6281 Muscle weakness (generalized): Secondary | ICD-10-CM | POA: Diagnosis not present

## 2023-05-03 DIAGNOSIS — R2681 Unsteadiness on feet: Secondary | ICD-10-CM | POA: Diagnosis not present

## 2023-05-03 DIAGNOSIS — M79672 Pain in left foot: Secondary | ICD-10-CM | POA: Diagnosis not present

## 2023-05-03 DIAGNOSIS — M5459 Other low back pain: Secondary | ICD-10-CM | POA: Diagnosis not present

## 2023-05-07 DIAGNOSIS — M6281 Muscle weakness (generalized): Secondary | ICD-10-CM | POA: Diagnosis not present

## 2023-05-07 DIAGNOSIS — M79672 Pain in left foot: Secondary | ICD-10-CM | POA: Diagnosis not present

## 2023-05-07 DIAGNOSIS — R2681 Unsteadiness on feet: Secondary | ICD-10-CM | POA: Diagnosis not present

## 2023-05-07 DIAGNOSIS — M5459 Other low back pain: Secondary | ICD-10-CM | POA: Diagnosis not present

## 2023-05-12 DIAGNOSIS — M6281 Muscle weakness (generalized): Secondary | ICD-10-CM | POA: Diagnosis not present

## 2023-05-12 DIAGNOSIS — M79672 Pain in left foot: Secondary | ICD-10-CM | POA: Diagnosis not present

## 2023-05-12 DIAGNOSIS — R2681 Unsteadiness on feet: Secondary | ICD-10-CM | POA: Diagnosis not present

## 2023-05-12 DIAGNOSIS — M5459 Other low back pain: Secondary | ICD-10-CM | POA: Diagnosis not present

## 2023-05-14 DIAGNOSIS — R2681 Unsteadiness on feet: Secondary | ICD-10-CM | POA: Diagnosis not present

## 2023-05-14 DIAGNOSIS — M6281 Muscle weakness (generalized): Secondary | ICD-10-CM | POA: Diagnosis not present

## 2023-05-14 DIAGNOSIS — M5459 Other low back pain: Secondary | ICD-10-CM | POA: Diagnosis not present

## 2023-05-14 DIAGNOSIS — M79672 Pain in left foot: Secondary | ICD-10-CM | POA: Diagnosis not present

## 2023-05-18 DIAGNOSIS — M79672 Pain in left foot: Secondary | ICD-10-CM | POA: Diagnosis not present

## 2023-05-18 DIAGNOSIS — R2681 Unsteadiness on feet: Secondary | ICD-10-CM | POA: Diagnosis not present

## 2023-05-18 DIAGNOSIS — M5459 Other low back pain: Secondary | ICD-10-CM | POA: Diagnosis not present

## 2023-05-18 DIAGNOSIS — M6281 Muscle weakness (generalized): Secondary | ICD-10-CM | POA: Diagnosis not present

## 2023-05-24 DIAGNOSIS — M5459 Other low back pain: Secondary | ICD-10-CM | POA: Diagnosis not present

## 2023-05-24 DIAGNOSIS — M6281 Muscle weakness (generalized): Secondary | ICD-10-CM | POA: Diagnosis not present

## 2023-05-24 DIAGNOSIS — R2681 Unsteadiness on feet: Secondary | ICD-10-CM | POA: Diagnosis not present

## 2023-05-24 DIAGNOSIS — M79672 Pain in left foot: Secondary | ICD-10-CM | POA: Diagnosis not present

## 2023-06-02 DIAGNOSIS — M79672 Pain in left foot: Secondary | ICD-10-CM | POA: Diagnosis not present

## 2023-06-02 DIAGNOSIS — R2681 Unsteadiness on feet: Secondary | ICD-10-CM | POA: Diagnosis not present

## 2023-06-02 DIAGNOSIS — M6281 Muscle weakness (generalized): Secondary | ICD-10-CM | POA: Diagnosis not present

## 2023-06-02 DIAGNOSIS — M5459 Other low back pain: Secondary | ICD-10-CM | POA: Diagnosis not present

## 2023-06-08 DIAGNOSIS — Z23 Encounter for immunization: Secondary | ICD-10-CM | POA: Diagnosis not present

## 2023-06-10 DIAGNOSIS — M5116 Intervertebral disc disorders with radiculopathy, lumbar region: Secondary | ICD-10-CM | POA: Diagnosis not present

## 2023-06-27 IMAGING — US US CAROTID DUPLEX BILAT
1 series · 13 of 24 positions shown · non-contrast
Comparison: 08/30/2019

CLINICAL DATA: Right carotid artery stenosis

Syncope
Visual disturbance
Hyperlipidemia
EXAM:
BILATERAL CAROTID DUPLEX ULTRASOUND
TECHNIQUE: Gray scale imaging, color Doppler and duplex ultrasound were
performed of bilateral carotid and vertebral arteries in the neck.

[Series 1: us carotid duplex bilat · 0.06mm/px · 13 of 88 slices shown]
[im 1/88]
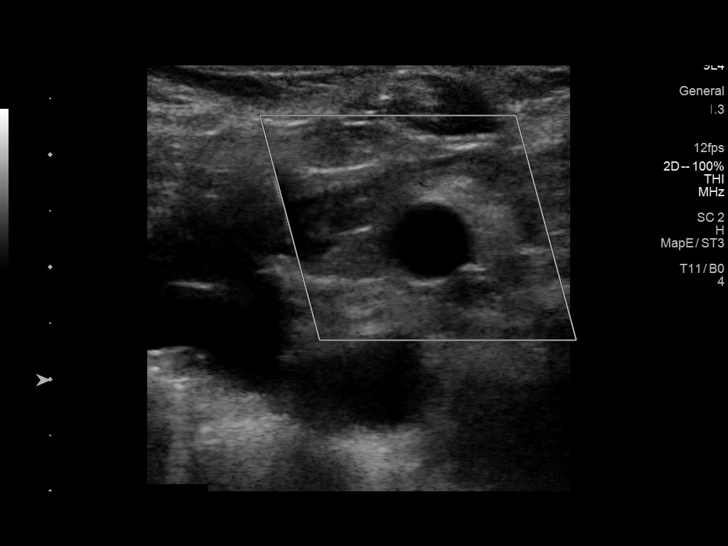
[im 8/88]
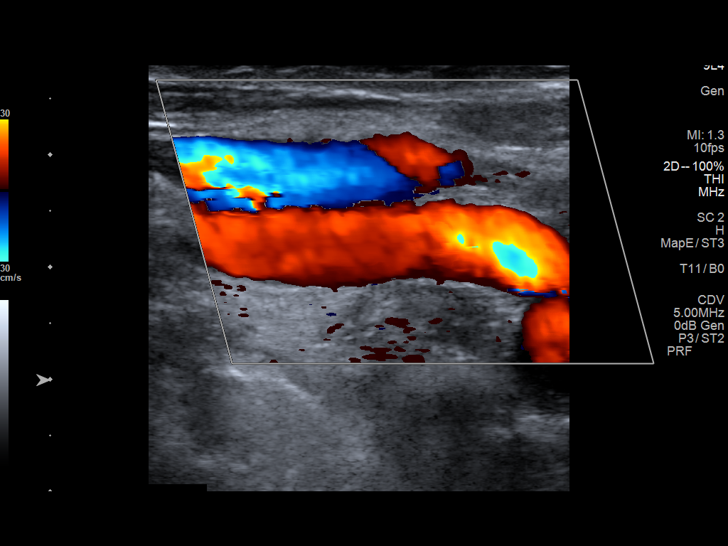
[im 16/88]
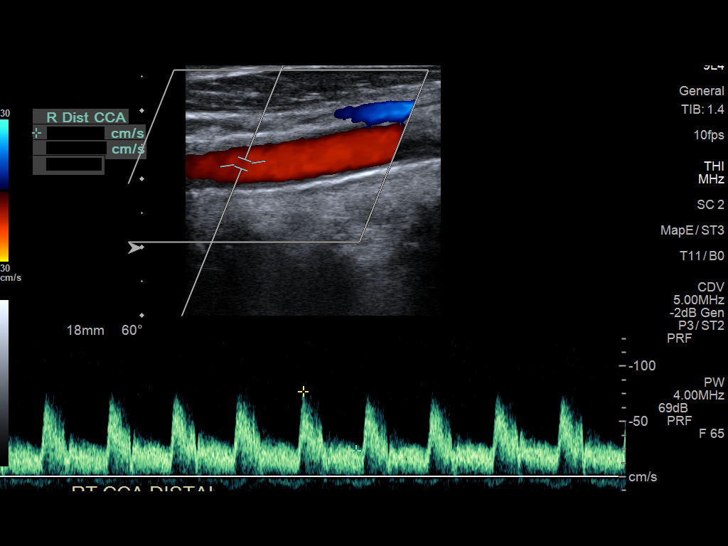
[im 23/88]
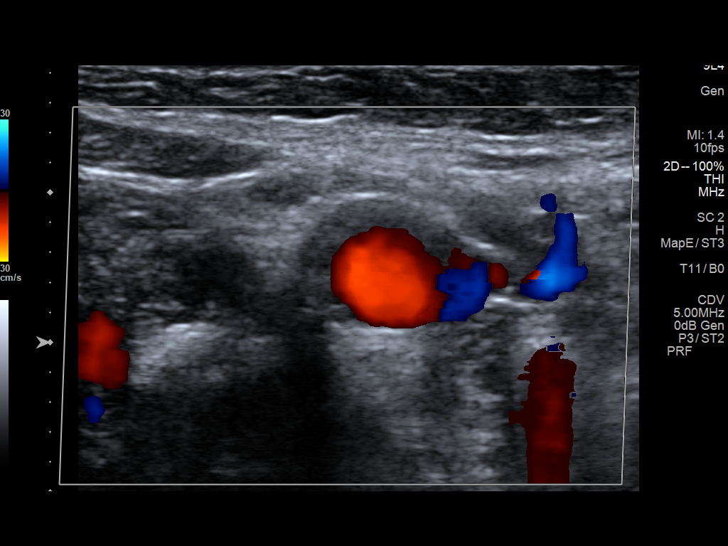
[im 31/88]
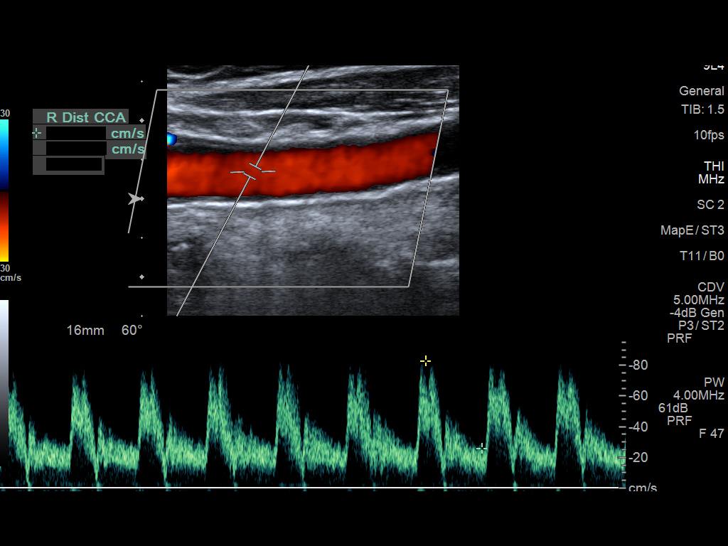
[im 38/88]
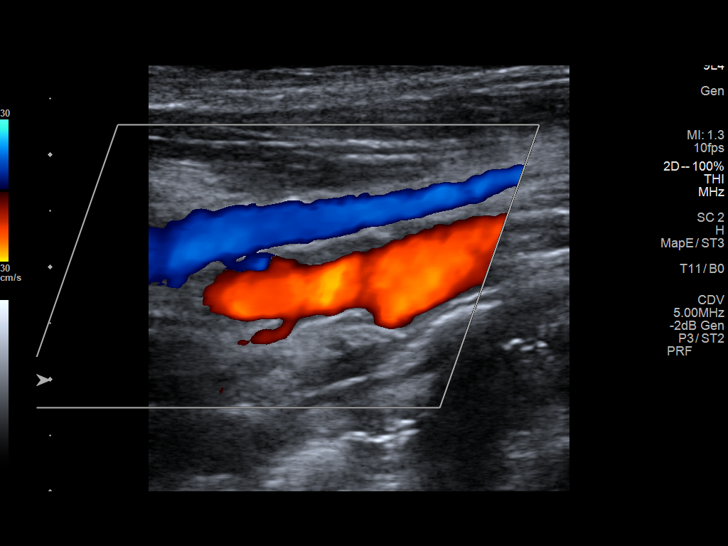
[im 46/88]
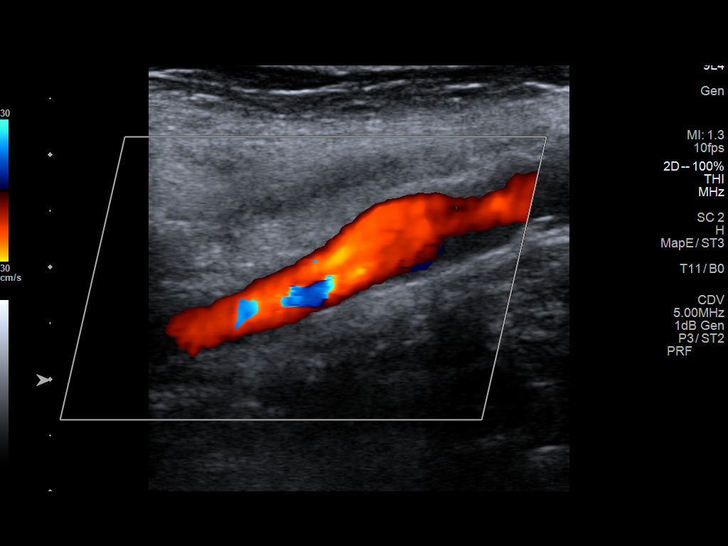
[im 50/88]
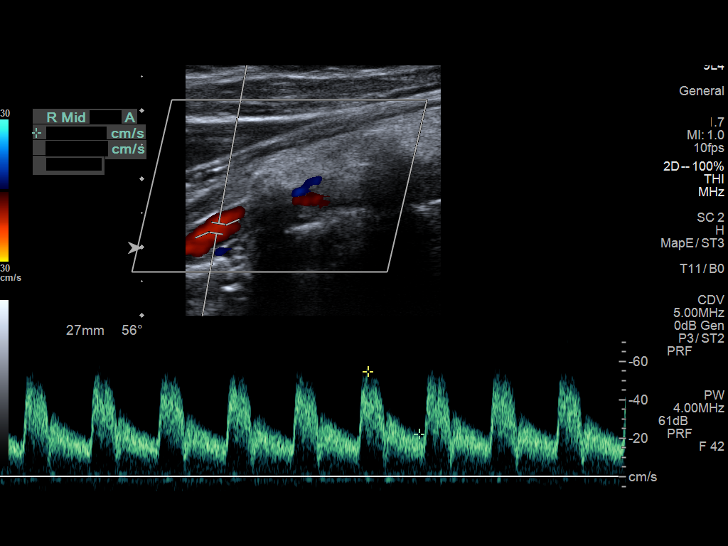
[im 57/88]
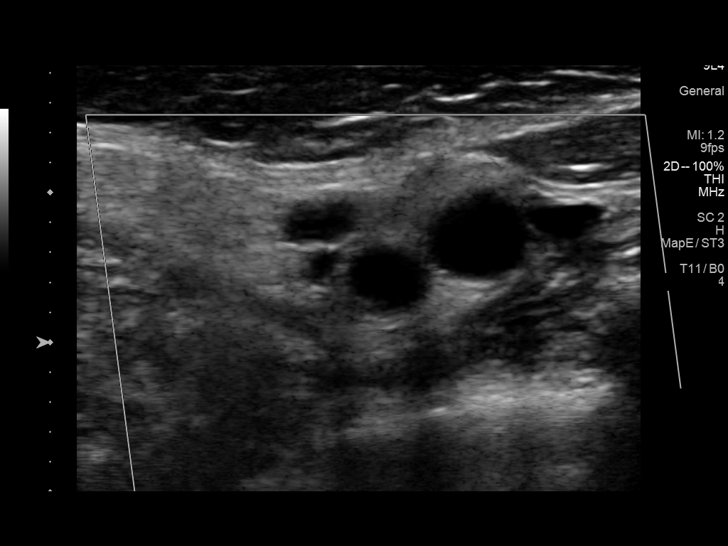
[im 65/88]
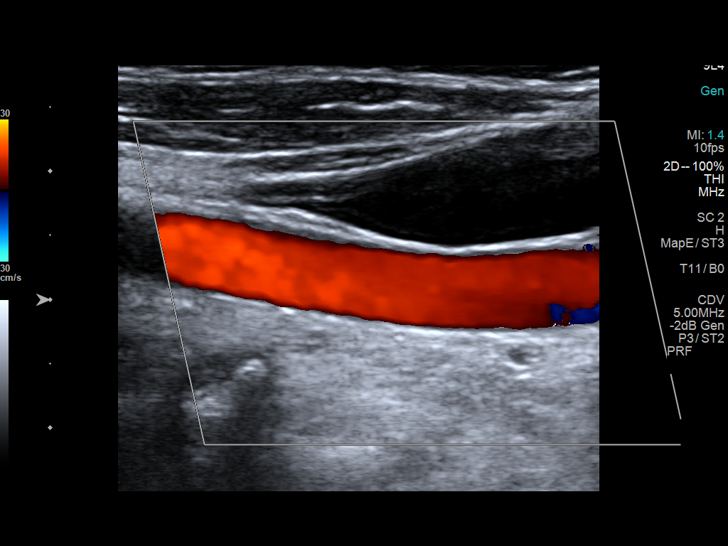
[im 72/88]
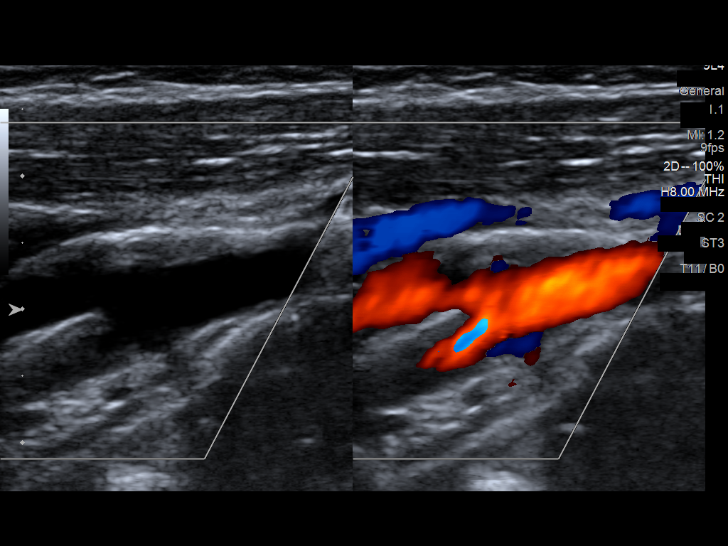
[im 80/88]
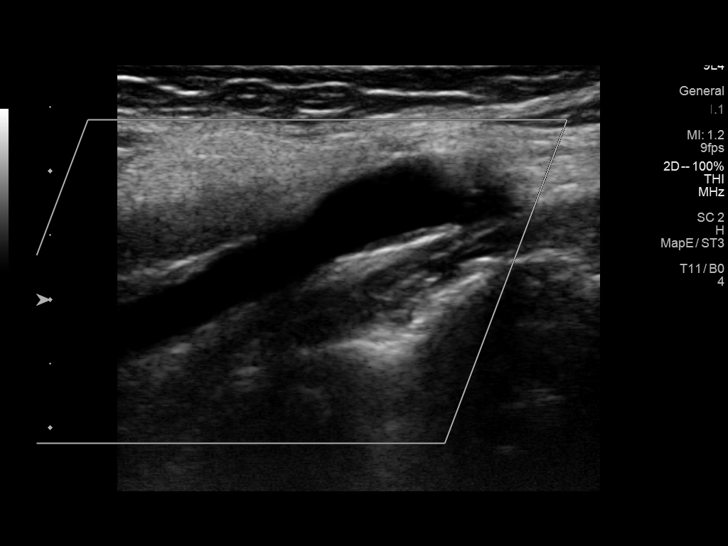
[im 88/88]
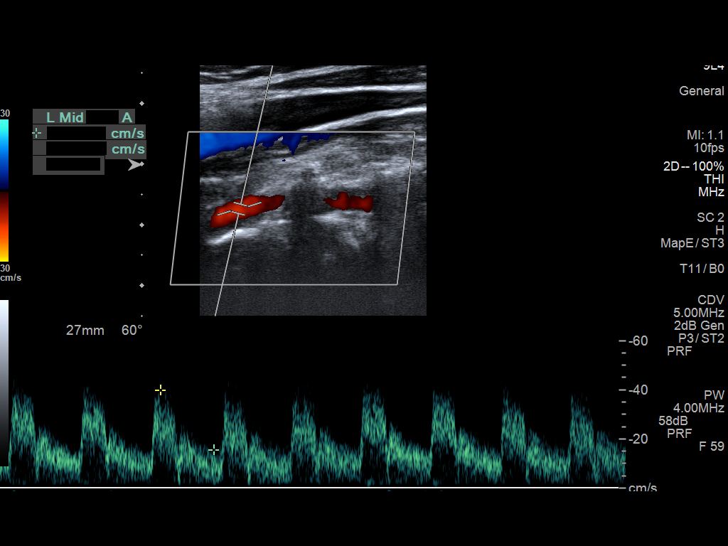

[13 of 24 positions shown; findings below may reference images not displayed]

FINDINGS: Criteria: Quantification of carotid stenosis is based on velocity
parameters that correlate the residual internal carotid diameter
with NASCET-based stenosis levels, using the diameter of the distal
internal carotid lumen as the denominator for stenosis measurement.

The following velocity measurements were obtained:

RIGHT

ICA: 118/46 cm/sec

CCA: 90/24 cm/sec

SYSTOLIC ICA/CCA RATIO:

ECA: 105/17 cm/sec

LEFT

ICA: 118/47 cm/sec

CCA: 101/24 cm/sec

SYSTOLIC ICA/CCA RATIO:

ECA: 85/16 cm/sec

RIGHT CAROTID ARTERY: Interval development of mild predominately
noncalcified plaque of the right ICA origin.

RIGHT VERTEBRAL ARTERY:  Antegrade flow.

LEFT CAROTID ARTERY: Mildly elevated end-diastolic velocity of the
left ICA is likely artifact given no significant atheromatous plaque
of the carotid bifurcation.

LEFT VERTEBRAL ARTERY:  Antegrade flow.
IMPRESSION: 1. Interval development of mild predominantly noncalcified
atheromatous plaque of the right ICA origin. Elevated end-diastolic
velocity suspicious for 50-69% stenosis.
2. No significant stenosis of the left internal carotid artery.

## 2023-07-12 DIAGNOSIS — N1831 Chronic kidney disease, stage 3a: Secondary | ICD-10-CM | POA: Diagnosis not present

## 2023-07-12 DIAGNOSIS — M48061 Spinal stenosis, lumbar region without neurogenic claudication: Secondary | ICD-10-CM | POA: Diagnosis not present

## 2023-07-12 DIAGNOSIS — S86812A Strain of other muscle(s) and tendon(s) at lower leg level, left leg, initial encounter: Secondary | ICD-10-CM | POA: Diagnosis not present

## 2023-07-12 DIAGNOSIS — M5416 Radiculopathy, lumbar region: Secondary | ICD-10-CM | POA: Diagnosis not present

## 2023-07-20 DIAGNOSIS — M4726 Other spondylosis with radiculopathy, lumbar region: Secondary | ICD-10-CM | POA: Diagnosis not present

## 2023-07-20 DIAGNOSIS — M5116 Intervertebral disc disorders with radiculopathy, lumbar region: Secondary | ICD-10-CM | POA: Diagnosis not present

## 2023-07-29 DIAGNOSIS — M5116 Intervertebral disc disorders with radiculopathy, lumbar region: Secondary | ICD-10-CM | POA: Diagnosis not present

## 2023-08-17 DIAGNOSIS — M5116 Intervertebral disc disorders with radiculopathy, lumbar region: Secondary | ICD-10-CM | POA: Diagnosis not present

## 2023-08-18 ENCOUNTER — Other Ambulatory Visit: Payer: Self-pay | Admitting: Medical Genetics

## 2023-08-24 ENCOUNTER — Other Ambulatory Visit: Payer: Self-pay

## 2023-08-24 DIAGNOSIS — Z006 Encounter for examination for normal comparison and control in clinical research program: Secondary | ICD-10-CM

## 2023-08-26 DIAGNOSIS — M48061 Spinal stenosis, lumbar region without neurogenic claudication: Secondary | ICD-10-CM | POA: Diagnosis not present

## 2023-08-26 DIAGNOSIS — G629 Polyneuropathy, unspecified: Secondary | ICD-10-CM | POA: Diagnosis not present

## 2023-08-30 DIAGNOSIS — Z1231 Encounter for screening mammogram for malignant neoplasm of breast: Secondary | ICD-10-CM | POA: Diagnosis not present

## 2023-08-31 DIAGNOSIS — H353132 Nonexudative age-related macular degeneration, bilateral, intermediate dry stage: Secondary | ICD-10-CM | POA: Diagnosis not present

## 2023-08-31 DIAGNOSIS — H2513 Age-related nuclear cataract, bilateral: Secondary | ICD-10-CM | POA: Diagnosis not present

## 2023-08-31 DIAGNOSIS — H401112 Primary open-angle glaucoma, right eye, moderate stage: Secondary | ICD-10-CM | POA: Diagnosis not present

## 2023-08-31 DIAGNOSIS — H52203 Unspecified astigmatism, bilateral: Secondary | ICD-10-CM | POA: Diagnosis not present

## 2023-09-01 DIAGNOSIS — C4441 Basal cell carcinoma of skin of scalp and neck: Secondary | ICD-10-CM | POA: Diagnosis not present

## 2023-09-01 DIAGNOSIS — L821 Other seborrheic keratosis: Secondary | ICD-10-CM | POA: Diagnosis not present

## 2023-09-01 DIAGNOSIS — L812 Freckles: Secondary | ICD-10-CM | POA: Diagnosis not present

## 2023-09-01 DIAGNOSIS — D485 Neoplasm of uncertain behavior of skin: Secondary | ICD-10-CM | POA: Diagnosis not present

## 2023-09-01 DIAGNOSIS — Z85828 Personal history of other malignant neoplasm of skin: Secondary | ICD-10-CM | POA: Diagnosis not present

## 2023-09-01 DIAGNOSIS — D225 Melanocytic nevi of trunk: Secondary | ICD-10-CM | POA: Diagnosis not present

## 2023-09-02 ENCOUNTER — Other Ambulatory Visit: Payer: Self-pay | Admitting: *Deleted

## 2023-09-02 DIAGNOSIS — I6521 Occlusion and stenosis of right carotid artery: Secondary | ICD-10-CM

## 2023-09-06 NOTE — Progress Notes (Unsigned)
 Marland Kitchen

## 2023-09-07 ENCOUNTER — Ambulatory Visit: Payer: Medicare PPO | Admitting: Vascular Surgery

## 2023-09-07 ENCOUNTER — Ambulatory Visit (HOSPITAL_COMMUNITY)
Admission: RE | Admit: 2023-09-07 | Discharge: 2023-09-07 | Disposition: A | Discharge status: Home / Self Care | Payer: Medicare PPO | Source: Ambulatory Visit | Attending: Vascular Surgery | Admitting: Vascular Surgery

## 2023-09-07 ENCOUNTER — Encounter: Payer: Self-pay | Admitting: Vascular Surgery

## 2023-09-07 VITALS — BP 133/80 | HR 77 | Temp 97.7°F | Resp 18 | Ht 67.0 in | Wt 149.4 lb

## 2023-09-07 DIAGNOSIS — I6523 Occlusion and stenosis of bilateral carotid arteries: Secondary | ICD-10-CM | POA: Diagnosis not present

## 2023-09-07 DIAGNOSIS — I6521 Occlusion and stenosis of right carotid artery: Secondary | ICD-10-CM | POA: Diagnosis not present

## 2023-09-07 LAB — GENECONNECT MOLECULAR SCREEN: Genetic Analysis Overall Interpretation: NEGATIVE

## 2023-09-08 DIAGNOSIS — H2513 Age-related nuclear cataract, bilateral: Secondary | ICD-10-CM | POA: Diagnosis not present

## 2023-09-08 DIAGNOSIS — H43393 Other vitreous opacities, bilateral: Secondary | ICD-10-CM | POA: Diagnosis not present

## 2023-09-08 DIAGNOSIS — H353113 Nonexudative age-related macular degeneration, right eye, advanced atrophic without subfoveal involvement: Secondary | ICD-10-CM | POA: Diagnosis not present

## 2023-09-08 DIAGNOSIS — H43813 Vitreous degeneration, bilateral: Secondary | ICD-10-CM | POA: Diagnosis not present

## 2023-09-08 DIAGNOSIS — H353122 Nonexudative age-related macular degeneration, left eye, intermediate dry stage: Secondary | ICD-10-CM | POA: Diagnosis not present

## 2023-09-15 DIAGNOSIS — M5116 Intervertebral disc disorders with radiculopathy, lumbar region: Secondary | ICD-10-CM | POA: Diagnosis not present

## 2023-09-29 DIAGNOSIS — L570 Actinic keratosis: Secondary | ICD-10-CM | POA: Diagnosis not present

## 2023-09-29 DIAGNOSIS — C4441 Basal cell carcinoma of skin of scalp and neck: Secondary | ICD-10-CM | POA: Diagnosis not present

## 2023-09-29 DIAGNOSIS — Z85828 Personal history of other malignant neoplasm of skin: Secondary | ICD-10-CM | POA: Diagnosis not present

## 2023-10-01 DIAGNOSIS — E7849 Other hyperlipidemia: Secondary | ICD-10-CM | POA: Diagnosis not present

## 2023-10-01 DIAGNOSIS — D649 Anemia, unspecified: Secondary | ICD-10-CM | POA: Diagnosis not present

## 2023-10-01 DIAGNOSIS — M810 Age-related osteoporosis without current pathological fracture: Secondary | ICD-10-CM | POA: Diagnosis not present

## 2023-10-01 DIAGNOSIS — N1831 Chronic kidney disease, stage 3a: Secondary | ICD-10-CM | POA: Diagnosis not present

## 2023-10-01 DIAGNOSIS — E041 Nontoxic single thyroid nodule: Secondary | ICD-10-CM | POA: Diagnosis not present

## 2023-10-01 DIAGNOSIS — E785 Hyperlipidemia, unspecified: Secondary | ICD-10-CM | POA: Diagnosis not present

## 2023-10-04 DIAGNOSIS — M5116 Intervertebral disc disorders with radiculopathy, lumbar region: Secondary | ICD-10-CM | POA: Diagnosis not present

## 2023-10-08 DIAGNOSIS — D649 Anemia, unspecified: Secondary | ICD-10-CM | POA: Diagnosis not present

## 2023-10-08 DIAGNOSIS — Z0000 Encounter for general adult medical examination without abnormal findings: Secondary | ICD-10-CM | POA: Diagnosis not present

## 2023-10-08 DIAGNOSIS — Z1339 Encounter for screening examination for other mental health and behavioral disorders: Secondary | ICD-10-CM | POA: Diagnosis not present

## 2023-10-08 DIAGNOSIS — N1831 Chronic kidney disease, stage 3a: Secondary | ICD-10-CM | POA: Diagnosis not present

## 2023-10-08 DIAGNOSIS — R82998 Other abnormal findings in urine: Secondary | ICD-10-CM | POA: Diagnosis not present

## 2023-10-08 DIAGNOSIS — E785 Hyperlipidemia, unspecified: Secondary | ICD-10-CM | POA: Diagnosis not present

## 2023-10-08 DIAGNOSIS — I6521 Occlusion and stenosis of right carotid artery: Secondary | ICD-10-CM | POA: Diagnosis not present

## 2023-10-08 DIAGNOSIS — B009 Herpesviral infection, unspecified: Secondary | ICD-10-CM | POA: Diagnosis not present

## 2023-10-08 DIAGNOSIS — H3530 Unspecified macular degeneration: Secondary | ICD-10-CM | POA: Diagnosis not present

## 2023-10-08 DIAGNOSIS — M48061 Spinal stenosis, lumbar region without neurogenic claudication: Secondary | ICD-10-CM | POA: Diagnosis not present

## 2023-10-08 DIAGNOSIS — Z1331 Encounter for screening for depression: Secondary | ICD-10-CM | POA: Diagnosis not present

## 2023-10-08 DIAGNOSIS — M810 Age-related osteoporosis without current pathological fracture: Secondary | ICD-10-CM | POA: Diagnosis not present

## 2023-11-02 DIAGNOSIS — Z1330 Encounter for screening examination for mental health and behavioral disorders, unspecified: Secondary | ICD-10-CM | POA: Diagnosis not present

## 2023-11-02 DIAGNOSIS — M5416 Radiculopathy, lumbar region: Secondary | ICD-10-CM | POA: Diagnosis not present

## 2023-11-04 DIAGNOSIS — M5417 Radiculopathy, lumbosacral region: Secondary | ICD-10-CM | POA: Diagnosis not present

## 2023-11-18 DIAGNOSIS — M48061 Spinal stenosis, lumbar region without neurogenic claudication: Secondary | ICD-10-CM | POA: Diagnosis not present

## 2023-12-23 DIAGNOSIS — M5416 Radiculopathy, lumbar region: Secondary | ICD-10-CM | POA: Diagnosis not present

## 2024-03-08 DIAGNOSIS — H401121 Primary open-angle glaucoma, left eye, mild stage: Secondary | ICD-10-CM | POA: Diagnosis not present

## 2024-03-08 DIAGNOSIS — H353132 Nonexudative age-related macular degeneration, bilateral, intermediate dry stage: Secondary | ICD-10-CM | POA: Diagnosis not present

## 2024-03-08 DIAGNOSIS — H2513 Age-related nuclear cataract, bilateral: Secondary | ICD-10-CM | POA: Diagnosis not present

## 2024-03-08 DIAGNOSIS — H401112 Primary open-angle glaucoma, right eye, moderate stage: Secondary | ICD-10-CM | POA: Diagnosis not present

## 2024-03-10 DIAGNOSIS — H43813 Vitreous degeneration, bilateral: Secondary | ICD-10-CM | POA: Diagnosis not present

## 2024-03-10 DIAGNOSIS — H43393 Other vitreous opacities, bilateral: Secondary | ICD-10-CM | POA: Diagnosis not present

## 2024-03-10 DIAGNOSIS — H353122 Nonexudative age-related macular degeneration, left eye, intermediate dry stage: Secondary | ICD-10-CM | POA: Diagnosis not present

## 2024-03-10 DIAGNOSIS — H2513 Age-related nuclear cataract, bilateral: Secondary | ICD-10-CM | POA: Diagnosis not present

## 2024-03-10 DIAGNOSIS — H353113 Nonexudative age-related macular degeneration, right eye, advanced atrophic without subfoveal involvement: Secondary | ICD-10-CM | POA: Diagnosis not present

## 2024-03-15 DIAGNOSIS — Z23 Encounter for immunization: Secondary | ICD-10-CM | POA: Diagnosis not present

## 2024-09-05 ENCOUNTER — Ambulatory Visit: Admitting: Vascular Surgery

## 2024-09-05 ENCOUNTER — Encounter (HOSPITAL_COMMUNITY)
